# Patient Record
Sex: Male | Born: 1950
Health system: Southern US, Community
[De-identification: ages and names within clinical notes are randomized; demographics above are authoritative.]

## PROBLEM LIST (undated history)

## (undated) DIAGNOSIS — R251 Tremor, unspecified: Secondary | ICD-10-CM

## (undated) DIAGNOSIS — H9313 Tinnitus, bilateral: Secondary | ICD-10-CM

## (undated) DIAGNOSIS — H903 Sensorineural hearing loss, bilateral: Secondary | ICD-10-CM

## (undated) DIAGNOSIS — J322 Chronic ethmoidal sinusitis: Secondary | ICD-10-CM

## (undated) DIAGNOSIS — J370 Chronic laryngitis: Secondary | ICD-10-CM

## (undated) DIAGNOSIS — J3501 Chronic tonsillitis: Secondary | ICD-10-CM

## (undated) DIAGNOSIS — Z9889 Other specified postprocedural states: Secondary | ICD-10-CM

## (undated) DIAGNOSIS — J301 Allergic rhinitis due to pollen: Secondary | ICD-10-CM

## (undated) DIAGNOSIS — J320 Chronic maxillary sinusitis: Secondary | ICD-10-CM

## (undated) DIAGNOSIS — K219 Gastro-esophageal reflux disease without esophagitis: Secondary | ICD-10-CM

## (undated) DIAGNOSIS — J45909 Unspecified asthma, uncomplicated: Secondary | ICD-10-CM

## (undated) HISTORY — DX: Allergic rhinitis due to pollen: J30.1

## (undated) HISTORY — PX: FRACTURE SURGERY: SHX138

## (undated) HISTORY — DX: Chronic laryngitis: J37.0

## (undated) HISTORY — DX: Tremor, unspecified: R25.1

## (undated) HISTORY — DX: Chronic maxillary sinusitis: J32.0

## (undated) HISTORY — DX: Other specified postprocedural states: Z98.890

## (undated) HISTORY — DX: Chronic tonsillitis: J35.01

## (undated) HISTORY — DX: Chronic ethmoidal sinusitis: J32.2

## (undated) HISTORY — DX: Unspecified asthma, uncomplicated: J45.909

## (undated) HISTORY — DX: Tinnitus, bilateral: H93.13

## (undated) HISTORY — DX: Sensorineural hearing loss, bilateral: H90.3

## (undated) HISTORY — DX: Gastro-esophageal reflux disease without esophagitis: K21.9

---

## 1995-05-15 DIAGNOSIS — Z9289 Personal history of other medical treatment: Secondary | ICD-10-CM

## 1995-05-15 HISTORY — DX: Personal history of other medical treatment: Z92.89

## 1999-06-09 ENCOUNTER — Encounter: Payer: Self-pay | Admitting: Family Medicine

## 1999-06-09 ENCOUNTER — Ambulatory Visit (HOSPITAL_COMMUNITY): Admission: RE | Admit: 1999-06-09 | Discharge: 1999-06-09 | Payer: Self-pay | Admitting: Family Medicine

## 1999-07-18 ENCOUNTER — Encounter: Payer: Self-pay | Admitting: Emergency Medicine

## 1999-07-18 ENCOUNTER — Emergency Department (HOSPITAL_COMMUNITY): Admission: EM | Admit: 1999-07-18 | Discharge: 1999-07-18 | Payer: Self-pay | Admitting: Emergency Medicine

## 1999-07-27 ENCOUNTER — Ambulatory Visit (HOSPITAL_COMMUNITY): Admission: RE | Admit: 1999-07-27 | Discharge: 1999-07-27 | Payer: Self-pay | Admitting: Neurology

## 1999-07-27 ENCOUNTER — Encounter: Payer: Self-pay | Admitting: Neurology

## 2003-05-15 HISTORY — PX: WRIST SURGERY: SHX841

## 2003-12-07 ENCOUNTER — Ambulatory Visit (HOSPITAL_COMMUNITY): Admission: RE | Admit: 2003-12-07 | Discharge: 2003-12-08 | Payer: Self-pay | Admitting: Orthopedic Surgery

## 2006-12-31 LAB — HM COLONOSCOPY

## 2007-08-07 ENCOUNTER — Emergency Department (HOSPITAL_COMMUNITY): Admission: EM | Admit: 2007-08-07 | Discharge: 2007-08-08 | Payer: Self-pay | Admitting: Emergency Medicine

## 2010-04-12 ENCOUNTER — Encounter: Admission: RE | Admit: 2010-04-12 | Discharge: 2010-04-12 | Payer: Self-pay | Admitting: Orthopedic Surgery

## 2010-09-29 NOTE — Consult Note (Signed)
. [facility]  Patient:    Scott Barker, Scott Barker                       MRN: [id number] Attending:  Glean Hess, M.D.                          Consultation Report  NO DICTATION DD:  07/18/99 TD:  07/18/99 Job: [id number]

## 2010-09-29 NOTE — Op Note (Signed)
Scott Barker, Scott Barker                        ACCOUNT NO.:  [id number]   MEDICAL RECORD NO.:  [id number]                   PATIENT TYPE:  OIB   LOCATION:  2899                                 FACILITY:  [facility]   PHYSICIAN:  Dionne Ano. Everlene Other, M.D.         DATE OF BIRTH:  1950/07/18   DATE OF PROCEDURE:  12/07/2003  DATE OF DISCHARGE:                                 OPERATIVE REPORT   PREOPERATIVE DIAGNOSIS:  Comminuted complex metaphyseal fractures about the  right and left wrist.  This patient has interval displacement, progressive  angulatory collapse, and poor position.  He is status post initial  stabilization in the [place] region.   POSTOPERATIVE DIAGNOSIS:  Comminuted complex metaphyseal fractures about the  right and left wrist.  This patient has interval displacement, progressive  angulatory collapse, and poor position.  He is status post initial  stabilization in the [place] region.   PROCEDURE:  1. Open reduction and internal fixation right comminuted distal radius     fracture with AccuMed plate and screw construct and Allomatrix bone     grafting from [facility].  2. Open reduction and internal fixation comminuted, complex left distal     radius fracture with AccuMed plate and screw construct and Allomatrix     bone graft.  3. Stress radiography bilateral upper extremity.   SURGEON:  Dionne Ano. Amanda Pea, M.D.   ASSISTANT:  Karie Chimera, P.A.-C.   COMPLICATIONS:  None.   DRAINS:  Two.   ANESTHESIA:  General.   ESTIMATED BLOOD LOSS:  Minimal.   INDICATIONS FOR PROCEDURE:  This patient presents with bilateral wrist  fractures.  I have discussed the risks and benefits of surgery including the  risks of infection, bleeding, anesthesia, damage to normal structures, and  failure of surgery to accomplish the intended.  With this in mind, she  desires to proceed.  All questions were encouraged and answered  preoperatively.   OPERATIVE FINDINGS:   The patient had comminuted complex fracture.  He  underwent ORIF without difficulty.  The left side was certainly more  comminuted than the right and we will hold him in supination for three weeks  postop in a long arm cast to prevent contracture.  We will be more  aggressive with the right arm in terms of its rehabilitative protocol and I  have discussed this with the family.   OPERATIVE PROCEDURE IN DETAIL:  The patient was seen by myself and  anesthesia.  He was given preoperative Ancef.  He was taken to the operative  suite and underwent the smooth induction of general anesthesia.  He was laid  supine, appropriately padded, prepped and draped in the usual sterile  fashion.  Once this was done, the patient had the right arm and left arm  secured nicely.  The operation commenced with the right arm ORIF.  The  tourniquet was inflated to 250 mmHg.  A volar radial incision  was made.  The  FCR was split palmarly and dorsally, retracted ulnarly.  The carpal canal  contents were retracted ulnarly.  The pronator quadratus was retracted in a  radial to ulnar fashion after it was incised in an L-shaped fashion and  later repaired at the end of the case.  The fracture site was then  identified.  After this, the fracture site was accessed, reduction was  accomplished, held provisionally with K-wires, and AccuMed plate was placed  followed by placement of allograft bone grafting in the metaphyseal defect  (dorsal V defect).  Once this was done, the patient then underwent stress  radiography revealing an excellent reduction.  I was pleased with the  findings, deflated the tourniquet, obtained hemostasis, placed a #7 TLS  drain, and following this, closed the wound about the pronator quadratus  with 3-0 Vicryl followed by closure of the subcu with 3-0 Vicryl followed by  closure of skin edges with Prolene.  The drain was hooked up to suction.  A  sterile dressing was applied.  At the conclusion of  both cases, the splint  was applied.  He tolerated the procedure well and there were no complicating  features in regards to the right upper extremity.  I should note that the  stress radiographs showed excellent position and good fixation.  I was quite  pleased with the fixation overall with this fracture.  It was certainly less  comminuted than the left.   Once this was done, the patient had the left upper extremity isolated.  The  left upper extremity had the tourniquet inflated to 250 mmHg.  Following  this, a volar radial incision was made.  Dissection was carried through the  sheath with the knife blade dorsally and palmarly.  The FCR was retracted  radially and the carpal canal contents were retracted in a radial to Paediatric nurse.  Following this, the pronator quadratus was split in an L-shaped  fashion, retracted in a radial to ulnar direction.  Following this, the  patient had the fracture site accessed.  This fracture was highly  comminuted.  I had to very carefully and meticulously piece together the  fracture site.  This was held together with three Kirschner wires followed  by placement of an Acculock plate.  The Acculock plate was placed without  difficulty in standard AO technique and had excellent purchase.  Following  this, allograft bone graft in the form of Allometric bone graft was placed  to fill the defect/void in the metaphyseal region.  Following this, the  patient had stress radiographs performed which revealed excellent reduction.  The patient had a stable distal radial ulnar joint, however, I am going to  keep him in supination due to the fact that he is highly comminuted in this  region.  Once this was done, the patient then underwent placement of TLS  drain.  The pronator quadratus was repaired with 3-0 Vicryl as it was on the  opposite side. The subcu was closed with 3-0 Vicryl, the skin edge with Prolene, and the patient had a soft dressing applied followed  by a long arm  splint with the forearm in supination.  The right upper extremity had a  short arm splint applied.  All compartments were soft.  He was vascular  intact with excellent refill at the conclusion of the case.  There were no  complicating features with the surgery.  He will be monitored in the  recovery room and admitted  for IV antibiotics, pain control, and postop  management.  I have discussed some dos and don'ts, etc.  In addition to  this, I have discussed with the family the plans postoperatively.  It was a  pleasure to participate in his care and look forward to participating in his  postoperative recovery.                                               Dionne Ano. Everlene Other, M.D.    Nash Mantis  D:  12/07/2003  T:  12/07/2003  Job:  [id number]

## 2011-02-05 LAB — POCT CARDIAC MARKERS
CKMB, poc: <1 — ABNORMAL LOW
Myoglobin, poc: 38.7
Myoglobin, poc: 42.7
Operator id: 161631
Troponin i, poc: <0.05

## 2011-02-05 LAB — POCT I-STAT, CHEM 8
BUN: 19
Creatinine, Ser: 1.5
Potassium: 4
Sodium: 141

## 2012-11-11 ENCOUNTER — Telehealth: Payer: Self-pay | Admitting: Radiation Oncology

## 2012-11-11 NOTE — Telephone Encounter (Signed)
Opened in error

## 2015-10-20 ENCOUNTER — Telehealth: Payer: Self-pay | Admitting: *Deleted

## 2015-10-20 ENCOUNTER — Encounter: Payer: Self-pay | Admitting: Internal Medicine

## 2015-10-20 ENCOUNTER — Ambulatory Visit (INDEPENDENT_AMBULATORY_CARE_PROVIDER_SITE_OTHER): Payer: BLUE CROSS/BLUE SHIELD | Admitting: Internal Medicine

## 2015-10-20 VITALS — BP 132/91 | HR 89 | Temp 98.1°F | Ht 72.0 in | Wt 176.2 lb

## 2015-10-20 DIAGNOSIS — H6505 Acute serous otitis media, recurrent, left ear: Secondary | ICD-10-CM

## 2015-10-20 DIAGNOSIS — Z789 Other specified health status: Secondary | ICD-10-CM | POA: Diagnosis not present

## 2015-10-20 DIAGNOSIS — R6884 Jaw pain: Secondary | ICD-10-CM

## 2015-10-20 DIAGNOSIS — J029 Acute pharyngitis, unspecified: Secondary | ICD-10-CM | POA: Diagnosis not present

## 2015-10-20 LAB — CBC WITH DIFFERENTIAL/PLATELET
BASOS ABS: 87 {cells}/uL (ref 0–200)
BASOS PCT: 1 %
EOS ABS: 174 {cells}/uL (ref 15–500)
Eosinophils Relative: 2 %
HCT: 50.2 % — ABNORMAL HIGH (ref 38.5–50.0)
HEMOGLOBIN: 17 g/dL (ref 13.2–17.1)
LYMPHS ABS: 2436 {cells}/uL (ref 850–3900)
Lymphocytes Relative: 28 %
MCH: 30.4 pg (ref 27.0–33.0)
MCHC: 33.9 g/dL (ref 32.0–36.0)
MCV: 89.8 fL (ref 80.0–100.0)
MONO ABS: 696 {cells}/uL (ref 200–950)
MPV: 11.1 fL (ref 7.5–12.5)
Monocytes Relative: 8 %
NEUTROS ABS: 5307 {cells}/uL (ref 1500–7800)
Neutrophils Relative %: 61 %
Platelets: 187 10*3/uL (ref 140–400)
RBC: 5.59 MIL/uL (ref 4.20–5.80)
RDW: 14.3 % (ref 11.0–15.0)
WBC: 8.7 10*3/uL (ref 3.8–10.8)

## 2015-10-20 LAB — COMPLETE METABOLIC PANEL WITH GFR
ALBUMIN: 4.3 g/dL (ref 3.6–5.1)
ALK PHOS: 64 U/L (ref 40–115)
ALT: 34 U/L (ref 9–46)
AST: 25 U/L (ref 10–35)
BILIRUBIN TOTAL: 0.6 mg/dL (ref 0.2–1.2)
BUN: 21 mg/dL (ref 7–25)
CO2: 26 mmol/L (ref 20–31)
CREATININE: 1.24 mg/dL (ref 0.70–1.25)
Calcium: 9.7 mg/dL (ref 8.6–10.3)
Chloride: 105 mmol/L (ref 98–110)
GFR, EST AFRICAN AMERICAN: 71 mL/min (ref >=60)
GFR, EST NON AFRICAN AMERICAN: 61 mL/min (ref >=60)
GLUCOSE: 83 mg/dL (ref 65–99)
Potassium: 5 mmol/L (ref 3.5–5.3)
Sodium: 142 mmol/L (ref 135–146)
TOTAL PROTEIN: 7.5 g/dL (ref 6.1–8.1)

## 2015-10-20 LAB — C-REACTIVE PROTEIN: CRP: <0.5 mg/dL (ref <0.60)

## 2015-10-20 NOTE — Telephone Encounter (Signed)
CT exams appointment:  [facility], [address] Wed., June 21, arrive at 10:00 AM.  Nothing solid to eat 4 hours prior to the scans, 6:30 AM.  Patient may have liquids.  Patient verbalized understanding.

## 2015-10-20 NOTE — Progress Notes (Signed)
RFV: chronic otitis externa Subjective:    Patient ID: Scott BonnetMark L Barker, male    DOB: 07-16-1950, 65 y.o.   MRN: [id number]  HPI Mr. Scott KailRoberston is a patient of Dr. Haroldine Barker referred to help with further management of chronic otitis externa. i have reviewed the records sent by Scott Barker office. This is a 65yo M with history of left side ear infection, started 3 days after returning from [place] in late oct 2016. He was doing family trip with his daughter in law- family. For 10 days stayed in nice accomodations. Did not get ill during the trip.  He noticed having fullness to left ear. That lingered for awhile and noticed left jaw pain. He was referred to dentistry to rule out tmj or dental caries. He started to see Scott Barker in mid March where he was 1st round of abtx, jaw pain improved. After 2 wks of being off of abtx started to have recurrence of pain. Thus 2nd round of  abtx was given, Again 2 wks after completion started to have recurrence of jaw pain. His 3rd course of abtx was noted in beginning of may, levofloxacin. he has had slightly longer window of symptom free of 4-5 wks. His Left ear still feels fullness, and now his right sided sore throat.   He does state that he has had history of chronic sinus issues since childhood  Allergies no known allergies   Meds:  None, regularly  Past med hx: bilateral fracture arms roughly 6031yr. S/p fixation. Seasonal allergies. No hx of childhood illness.Chelation therapy in the past with dr. Alessandra Barker. No longer seeing her  Family hx:   No family history of seasonal allergies or ear infections. htn   Dr. Cliffton Barker at [facility] is pcp   Review of Systems No fever or chills. But more temperature disregulation. Otherwise 10 point ros is negative    Objective:   Physical Exam BP 132/91 mmHg  Pulse 89  Temp(Src) 98.1 F (36.7 C) (Oral)  Ht 6' (1.829 m)  Wt 176 lb 4 oz (79.946 kg)  BMI 23.90 kg/m2 .Physical Exam  Constitutional: He is oriented to  person, place, and time. He appears well-developed and well-nourished. No distress.  HENT:  Mouth/Throat: Oropharynx is clear and moist. No oropharyngeal exudate. TM are clear Cardiovascular: Normal rate, regular rhythm and normal heart sounds. Exam reveals no gallop and no friction rub.  No murmur heard.  Pulmonary/Chest: Effort normal and breath sounds normal. No respiratory distress. He has no wheezes.  Abdominal: Soft. Bowel sounds are normal. He exhibits no distension. There is no tenderness.  Lymphadenopathy:  He has no cervical adenopathy.  Neurological: He is alert and oriented to person, place, and time.  Skin: Skin is warm and dry. No rash noted. No erythema.  Psychiatric: He has a normal mood and affect. His behavior is normal.   Lab Results  Component Value Date   ESRSEDRATE 1 10/20/2015   Lab Results  Component Value Date   CRP <0.5 10/20/2015   Lab Results  Component Value Date   WBC 8.7 10/20/2015   HGB 17.0 10/20/2015   HCT 50.2* 10/20/2015   MCV 89.8 10/20/2015   PLT 187 10/20/2015   Imaging: IMPRESSION: heat CT  1. Largely unremarkable CT appearance of the face and neck. No neck mass, lymphadenopathy, or inflammatory process identified. The mandible appears intact with no acute dental disease evident. Normally pneumatized middle ears and mastoids. 2. There is fairly advanced chronic cervical spine degeneration noted.  Assessment & Plan:  Chronic otitis externa = at this time appear stable. Scott not recommend further abtx at this time  Jaw pain = this may be referred pain vs. Signs of early osteomyelitis. Scott check head CT and check inflammatory markers. If symptoms progress, may need to get mri of jaw.   Pharyngitis = likely uri. Recommend supportive care  Spent 45 min with patient with greater than 50% in coordination of care, discussion of options for further management

## 2015-10-21 LAB — SEDIMENTATION RATE: SED RATE: 1 mm/h (ref 0–20)

## 2015-10-21 LAB — HEPATITIS C ANTIBODY: HCV AB: NEGATIVE

## 2015-10-21 LAB — HIV ANTIBODY (ROUTINE TESTING W REFLEX): HIV 1&2 Ab, 4th Generation: NONREACTIVE

## 2015-10-31 ENCOUNTER — Other Ambulatory Visit: Payer: Self-pay | Admitting: Internal Medicine

## 2015-10-31 DIAGNOSIS — R6884 Jaw pain: Secondary | ICD-10-CM

## 2015-11-02 ENCOUNTER — Ambulatory Visit
Admission: RE | Admit: 2015-11-02 | Discharge: 2015-11-02 | Disposition: A | Discharge status: Home / Self Care | Payer: BLUE CROSS/BLUE SHIELD | Source: Ambulatory Visit | Attending: Internal Medicine | Admitting: Internal Medicine

## 2015-11-02 ENCOUNTER — Telehealth: Payer: Self-pay | Admitting: *Deleted

## 2015-11-02 DIAGNOSIS — J029 Acute pharyngitis, unspecified: Secondary | ICD-10-CM

## 2015-11-02 DIAGNOSIS — R6884 Jaw pain: Secondary | ICD-10-CM

## 2015-11-02 MED ORDER — IOPAMIDOL (ISOVUE-300) INJECTION 61%
72.0000 mL | Freq: Once | INTRAVENOUS | Status: AC | PRN
  Administered 2015-11-02: 72 mL via INTRAVENOUS

## 2015-11-02 NOTE — Telephone Encounter (Signed)
Combining scan to include head and neck, charge for only one scan to the patient.

## 2015-12-05 ENCOUNTER — Ambulatory Visit: Payer: BLUE CROSS/BLUE SHIELD | Admitting: Internal Medicine

## 2015-12-09 ENCOUNTER — Encounter: Payer: Self-pay | Admitting: Internal Medicine

## 2015-12-13 ENCOUNTER — Encounter: Payer: Self-pay | Admitting: Internal Medicine

## 2015-12-13 ENCOUNTER — Ambulatory Visit (INDEPENDENT_AMBULATORY_CARE_PROVIDER_SITE_OTHER): Payer: BLUE CROSS/BLUE SHIELD | Admitting: Internal Medicine

## 2015-12-13 VITALS — BP 153/97 | HR 64 | Temp 97.8°F | Wt 175.0 lb

## 2015-12-13 DIAGNOSIS — H6505 Acute serous otitis media, recurrent, left ear: Secondary | ICD-10-CM | POA: Diagnosis not present

## 2016-01-20 NOTE — Progress Notes (Signed)
   RFV: follow up for recurrent otitis externa  Patient ID: Scott Barker, male   DOB: Nov 09, 1950, 65 y.o.   MRN: [id number]  HPI  65yo M with history of recurrent otitis externa was initially seen roughly 7-8 wk in ID clinic to see if he had signs concerning for more invasive disease, possibly osteomyelitis due to recurrent symptoms. At that visit, his symptoms were minimal and did not suggest need for ongoing abtx. He did undergo CT imaging that was unremarkable. Since your initial appointment, he states that he has not really had much further difficulty with ear or jaw discomfort.   No outpatient encounter prescriptions on file as of 12/13/2015.   No facility-administered encounter medications on file as of 12/13/2015.      There are no active problems to display for this patient.    Health Maintenance Due  Topic Date Due  . TETANUS/TDAP  02/25/1970  . COLONOSCOPY  02/25/2001  . ZOSTAVAX  02/26/2011  . INFLUENZA VACCINE  12/13/2015     Review of Systems  Physical Exam   BP (!) 153/97   Pulse 64   Temp 97.8 F (36.6 C) (Oral)   Wt 175 lb (79.4 kg)   BMI 23.73 kg/m   Physical Exam  Constitutional: He is oriented to person, place, and time. He appears well-developed and well-nourished. No distress.  HENT:  Mouth/Throat: Oropharynx is clear and moist. No oropharyngeal exudate.  Psychiatric: He has a normal mood and affect. His behavior is normal.    CBC Lab Results  Component Value Date   WBC 8.7 10/20/2015   RBC 5.59 10/20/2015   HGB 17.0 10/20/2015   HCT 50.2 (H) 10/20/2015   PLT 187 10/20/2015   MCV 89.8 10/20/2015   MCH 30.4 10/20/2015   MCHC 33.9 10/20/2015   RDW 14.3 10/20/2015   LYMPHSABS 2,436 10/20/2015   MONOABS 696 10/20/2015   EOSABS 174 10/20/2015   BASOSABS 87 10/20/2015   BMET Lab Results  Component Value Date   NA 142 10/20/2015   K 5.0 10/20/2015   CL 105 10/20/2015   CO2 26 10/20/2015   GLUCOSE 83 10/20/2015   BUN 21 10/20/2015   CREATININE 1.24 10/20/2015   CALCIUM 9.7 10/20/2015   GFRNONAA 61 10/20/2015   GFRAA 71 10/20/2015     Assessment and Plan  Recurrent otitis externa = appears to be resolved. For now, would not recommend any further testing unless flare occurs.

## 2016-08-29 ENCOUNTER — Emergency Department (HOSPITAL_COMMUNITY)
Admission: EM | Admit: 2016-08-29 | Discharge: 2016-08-29 | Disposition: A | Discharge status: Home / Self Care | Payer: Medicare Other | Attending: Emergency Medicine | Admitting: Emergency Medicine

## 2016-08-29 ENCOUNTER — Encounter (HOSPITAL_COMMUNITY): Payer: Self-pay | Admitting: Emergency Medicine

## 2016-08-29 ENCOUNTER — Emergency Department (HOSPITAL_COMMUNITY): Payer: Medicare Other

## 2016-08-29 DIAGNOSIS — R1032 Left lower quadrant pain: Secondary | ICD-10-CM | POA: Diagnosis present

## 2016-08-29 DIAGNOSIS — J45909 Unspecified asthma, uncomplicated: Secondary | ICD-10-CM | POA: Insufficient documentation

## 2016-08-29 LAB — URINALYSIS, ROUTINE W REFLEX MICROSCOPIC
BILIRUBIN URINE: NEGATIVE
GLUCOSE, UA: NEGATIVE mg/dL
HGB URINE DIPSTICK: NEGATIVE
Ketones, ur: 80 mg/dL — AB
Leukocytes, UA: NEGATIVE
Nitrite: NEGATIVE
Protein, ur: NEGATIVE mg/dL
SPECIFIC GRAVITY, URINE: 1.014 (ref 1.005–1.030)
pH: 5 (ref 5.0–8.0)

## 2016-08-29 LAB — COMPREHENSIVE METABOLIC PANEL
ALBUMIN: 4.6 g/dL (ref 3.5–5.0)
ALK PHOS: 56 U/L (ref 38–126)
ALT: 18 U/L (ref 17–63)
AST: 19 U/L (ref 15–41)
Anion gap: 9 (ref 5–15)
BUN: 17 mg/dL (ref 6–20)
CALCIUM: 9.4 mg/dL (ref 8.9–10.3)
CHLORIDE: 103 mmol/L (ref 101–111)
CO2: 26 mmol/L (ref 22–32)
CREATININE: 1.05 mg/dL (ref 0.61–1.24)
GFR calc Af Amer: >60 mL/min (ref >60)
GFR calc non Af Amer: >60 mL/min (ref >60)
GLUCOSE: 86 mg/dL (ref 65–99)
Potassium: 4.2 mmol/L (ref 3.5–5.1)
SODIUM: 138 mmol/L (ref 135–145)
Total Bilirubin: 1.6 mg/dL — ABNORMAL HIGH (ref 0.3–1.2)
Total Protein: 8 g/dL (ref 6.5–8.1)

## 2016-08-29 LAB — CBC
HCT: 50.2 % (ref 39.0–52.0)
HEMOGLOBIN: 17.3 g/dL — AB (ref 13.0–17.0)
MCH: 30.5 pg (ref 26.0–34.0)
MCHC: 34.5 g/dL (ref 30.0–36.0)
MCV: 88.5 fL (ref 78.0–100.0)
PLATELETS: 191 10*3/uL (ref 150–400)
RBC: 5.67 MIL/uL (ref 4.22–5.81)
RDW: 13.5 % (ref 11.5–15.5)
WBC: 6 10*3/uL (ref 4.0–10.5)

## 2016-08-29 LAB — LIPASE, BLOOD: LIPASE: 38 U/L (ref 11–51)

## 2016-08-29 MED ORDER — SODIUM CHLORIDE 0.9 % IV SOLN
INTRAVENOUS | Status: DC
  Administered 2016-08-29: 13:00:00 via INTRAVENOUS

## 2016-08-29 MED ORDER — IOPAMIDOL (ISOVUE-300) INJECTION 61%
INTRAVENOUS | Status: AC
Start: 2016-08-29 — End: 2016-08-29
  Administered 2016-08-29: 100 mL via INTRAVENOUS
  Filled 2016-08-29: qty 100

## 2016-08-29 NOTE — ED Provider Notes (Signed)
[facility]-EMERGENCY DEPT Provider Note   CSN: [id number] Arrival date & time: 08/29/16  8413     History   Chief Complaint Chief Complaint  Patient presents with  . Abdominal Pain    HPI Scott Barker is a 66 y.o. male.  HPI  Patient presents with concern of ongoing abdominal pain. The pain began about one week ago, since onset in the left lower quadrant and has began to radiate across the entire lower abdomen. Patient also describes some loose stool, though no frank diarrhea. No associated hunger changes, no nausea, no vomiting. Since onset no clear alleviating or exacerbating factors, though food intake may make the pain better inconsistently. Patient notes he is generally well, denies potential medical problems. Patient has had one colonoscopy, which was unremarkable.   Past Medical History:  Diagnosis Date  . Asthma   . Chronic ethmoidal sinusitis   . Chronic laryngitis   . Chronic maxillary sinusitis   . Chronic tonsillitis   . GERD (gastroesophageal reflux disease)   . Hay fever   . Sensory hearing loss, bilateral   . Tinnitus of both ears     There are no active problems to display for this patient.   Past Surgical History:  Procedure Laterality Date  . FRACTURE SURGERY     wrist       Home Medications    Prior to Admission medications   Not on File    Family History No family history on file.  Social History Social History  Substance Use Topics  . Smoking status: Never Smoker  . Smokeless tobacco: Never Used  . Alcohol use No     Allergies   Patient has no allergy information on record.   Review of Systems Review of Systems  Constitutional:       Per HPI, otherwise negative  HENT:       Per HPI, otherwise negative  Respiratory:       Per HPI, otherwise negative  Cardiovascular:       Per HPI, otherwise negative  Gastrointestinal: Positive for abdominal pain. Negative for vomiting.  Endocrine:       Negative aside from HPI    Genitourinary:       Neg aside from HPI   Musculoskeletal:       Per HPI, otherwise negative  Skin: Negative.   Neurological: Negative for syncope.     Physical Exam Updated Vital Signs BP (!) 144/96 (BP Location: Right Arm)   Pulse 74   Temp 97.5 F (36.4 C) (Oral)   Resp 15   Ht 6' (1.829 m)   Wt 164 lb (74.4 kg)   SpO2 100%   BMI 22.24 kg/m   Physical Exam  Constitutional: He is oriented to person, place, and time. He appears well-developed. No distress.  HENT:  Head: Normocephalic and atraumatic.  Eyes: Conjunctivae and EOM are normal.  Cardiovascular: Normal rate and regular rhythm.   Pulmonary/Chest: Effort normal. No stridor. No respiratory distress.  Abdominal: He exhibits no distension.  Mild lower abdominal pain, no guarding, no rebound, no mass  Musculoskeletal: He exhibits no edema.  Neurological: He is alert and oriented to person, place, and time.  Skin: Skin is warm and dry.  Psychiatric: He has a normal mood and affect.  Nursing note and vitals reviewed.    ED Treatments / Results  Labs (all labs ordered are listed, but only abnormal results are displayed) Labs Reviewed  COMPREHENSIVE METABOLIC PANEL - Abnormal; Notable for the  following:       Result Value   Total Bilirubin 1.6 (*)    All other components within normal limits  CBC - Abnormal; Notable for the following:    Hemoglobin 17.3 (*)    All other components within normal limits  URINALYSIS, ROUTINE W REFLEX MICROSCOPIC - Abnormal; Notable for the following:    Ketones, ur 80 (*)    All other components within normal limits  LIPASE, BLOOD    Radiology Ct Abdomen Pelvis W Contrast  Result Date: 08/29/2016 CLINICAL DATA:  Left lower quadrant pain. Bowel changes. Symptoms for 10 days. EXAM: CT ABDOMEN AND PELVIS WITH CONTRAST TECHNIQUE: Multidetector CT imaging of the abdomen and pelvis was performed using the standard protocol following bolus administration of intravenous contrast.  CONTRAST:  ISOVUE-300 IOPAMIDOL (ISOVUE-300) INJECTION 61% COMPARISON:  None. FINDINGS: Lower chest:  No contributory findings. Hepatobiliary: Patchy low-density in the right liver which is likely steatosis. No underlying vascular or biliary abnormality is noted. Subcentimeter low-density in the inferior right liver which is indeterminate density on source images, but appears cystic on the thin or coronal reformats. There is no reported history of malignancy. Pancreas: Unremarkable. Spleen: Subcentimeter low-density anteriorly, usually incidental. Adrenals/Urinary Tract: Negative adrenals. No hydronephrosis or stone. Unremarkable bladder. Stomach/Bowel:  No obstruction. No appendicitis. Vascular/Lymphatic: Atherosclerotic wall thickening and calcification of the aorta and proximal iliacs. No acute vascular finding. No mass or adenopathy. Reproductive:Mild symmetric enlargement of the prostate. Other: No ascites or pneumoperitoneum. Musculoskeletal: Spondylosis and disc degeneration. No acute or aggressive finding IMPRESSION: 1. No acute finding to explain abdominal pain. 2. Patchy steatosis in the right liver. 3.  Aortic Atherosclerosis (ICD10-170.0) Electronically Signed   By: Marnee Spring M.D.   On: 08/29/2016 13:53    Procedures Procedures (including critical care time)  Medications Ordered in ED Medications  0.9 %  sodium chloride infusion ( Intravenous New Bag/Given 08/29/16 1236)  iopamidol (ISOVUE-300) 61 % injection (100 mLs Intravenous Contrast Given 08/29/16 1334)     Initial Impression / Assessment and Plan / ED Course  I have reviewed the triage vital signs and the nursing notes.  Pertinent labs & imaging results that were available during my care of the patient were reviewed by me and considered in my medical decision making (see chart for details).  On repeat exam the patient is in no distress. I discussed all findings the patient and his wife, including ketonuria, some  concern for mild dehydration, possible given the patient's persistent abdominal pain. Patient has received 1 unit fluid resuscitation, was encouraged to pursue oral hydration. With no evidence for diverticulosis, colitis, acute abdominal processes, with unremarkable labs aside from mild hyperbilirubinemia, the patient is appropriate for further evaluation, management as an outpatient. Gastroenterology follow-up referral provided.   Final Clinical Impressions(s) / ED Diagnoses  Abdominal pain   Gerhard Munch, MD 08/29/16 [phone]

## 2016-08-29 NOTE — Discharge Instructions (Signed)
As discussed, your evaluation today has been largely reassuring.  But, it is important that you monitor your condition carefully, and do not hesitate to return to the ED if you develop new, or concerning changes in your condition. ? ?Otherwise, please follow-up with your physician for appropriate ongoing care. ? ?

## 2016-08-29 NOTE — ED Triage Notes (Signed)
Patient c/o abd pain that has been going on over week. Patient states that pain started out LLQ and now lower abd. Patient denies d/v, states intermittent nausea but can eat and drink fine.  Patient states that pain is intermittent.

## 2018-08-24 IMAGING — CT CT ABD-PELV W/ CM
2 of 5 series · 16 of 46 positions shown, 18 images · IV contrast (ISOVUE)
Comparison: None.

CLINICAL DATA: Left lower quadrant pain. Bowel changes. Symptoms
for 10 days.

EXAM:
CT ABDOMEN AND PELVIS WITH CONTRAST
TECHNIQUE: Multidetector CT imaging of the abdomen and pelvis was performed
using the standard protocol following bolus administration of
intravenous contrast.
CONTRAST:  100mL [id number] IOPAMIDOL (XZ2HIJ-F66) INJECTION 61%

[Series 2: abd/pel with · axial · 0.78mm/px · z∈[+1180,+1575]mm · 13 of 91 slices shown, 15 images]
[im 6/91  soft-tissue]
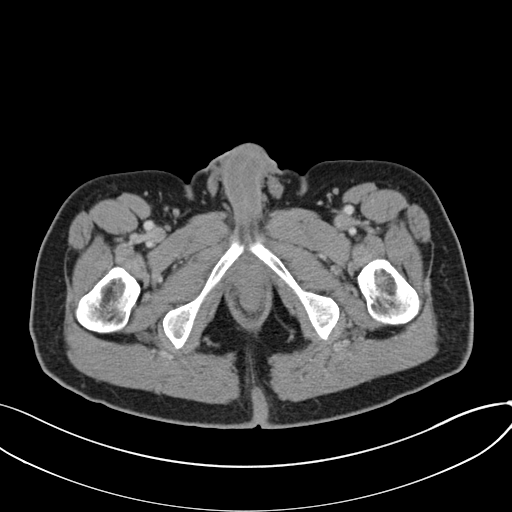
[im 6/91  bone]
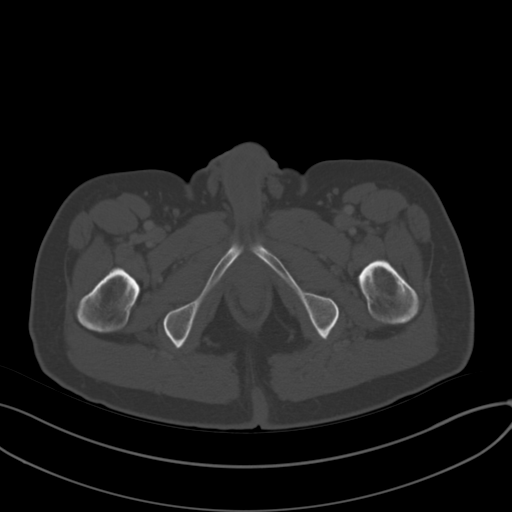
[im 12/91  soft-tissue]
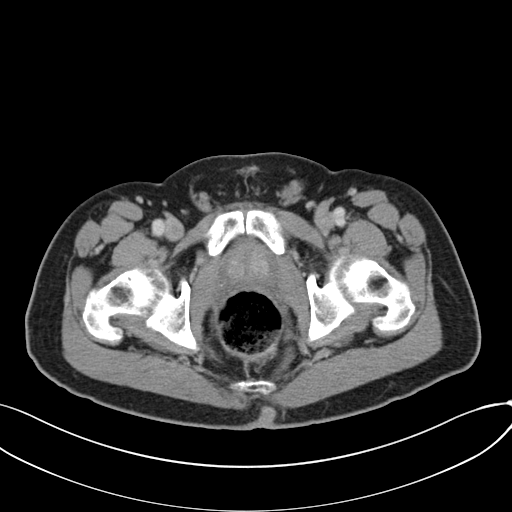
[im 17/91  soft-tissue]
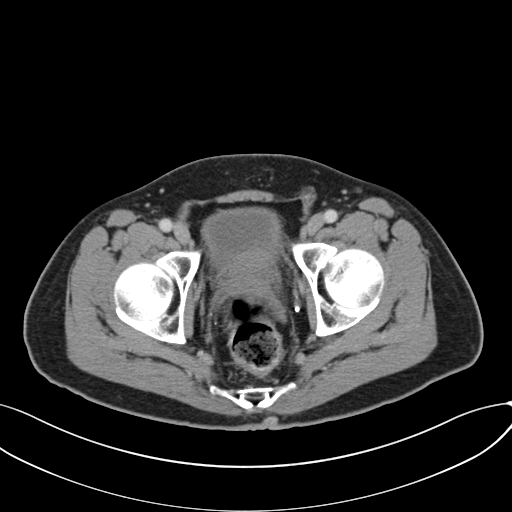
[im 29/91  soft-tissue]
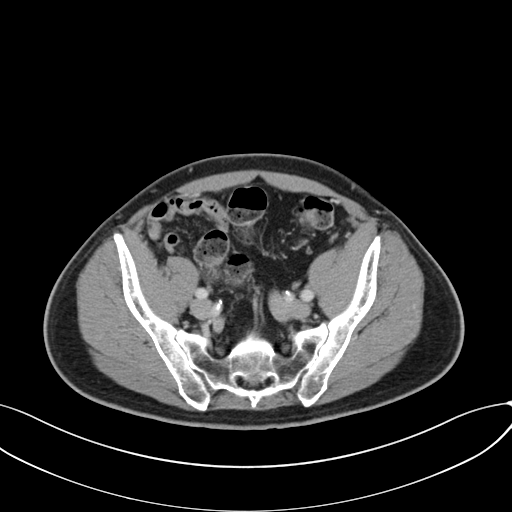
[im 34/91  soft-tissue]
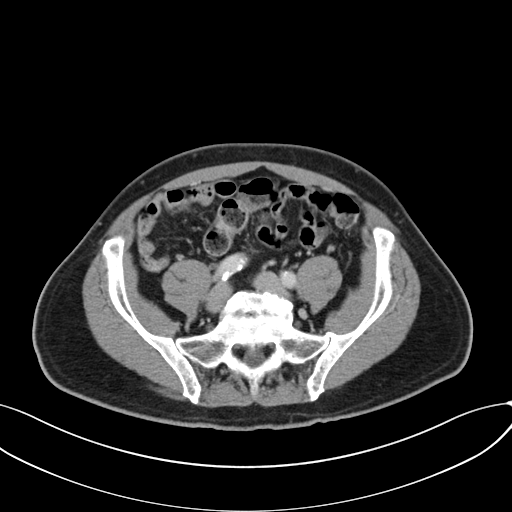
[im 40/91  soft-tissue]
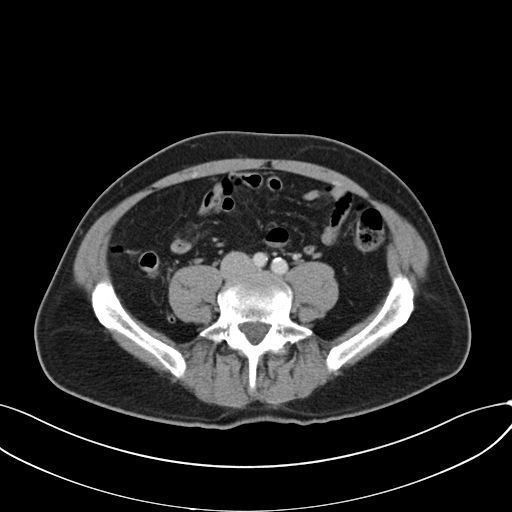
[im 46/91  soft-tissue]
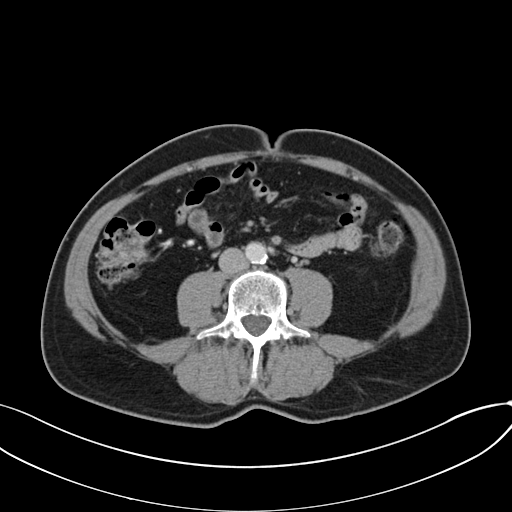
[im 51/91  soft-tissue]
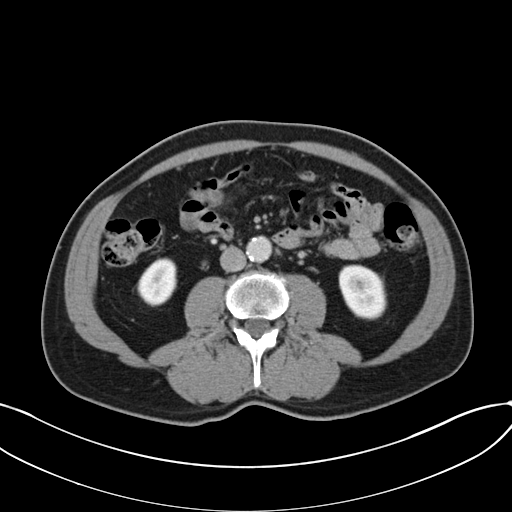
[im 57/91  soft-tissue]
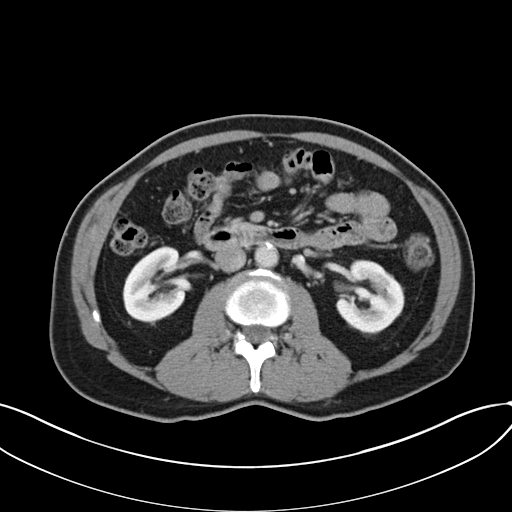
[im 57/91  bone]
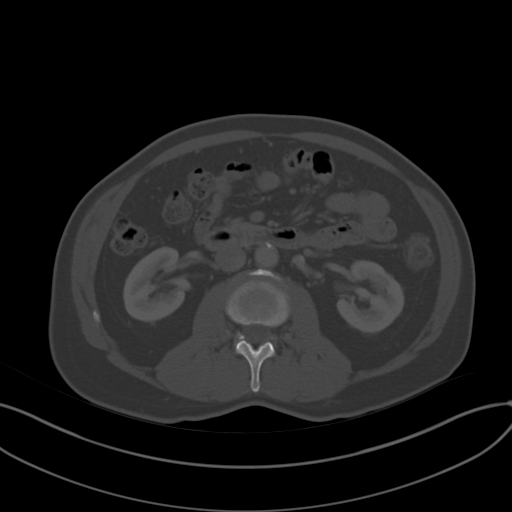
[im 62/91  soft-tissue]
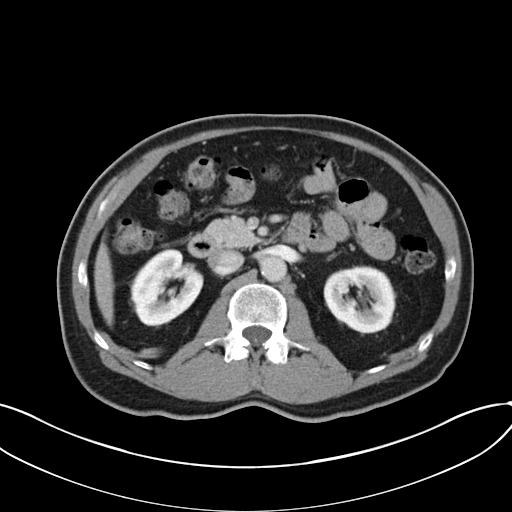
[im 74/91  soft-tissue]
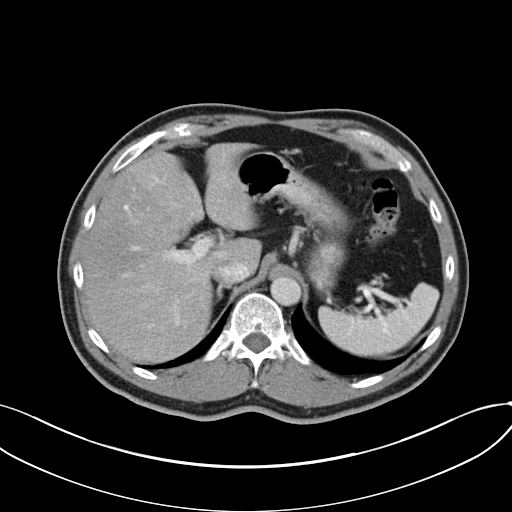
[im 79/91  soft-tissue]
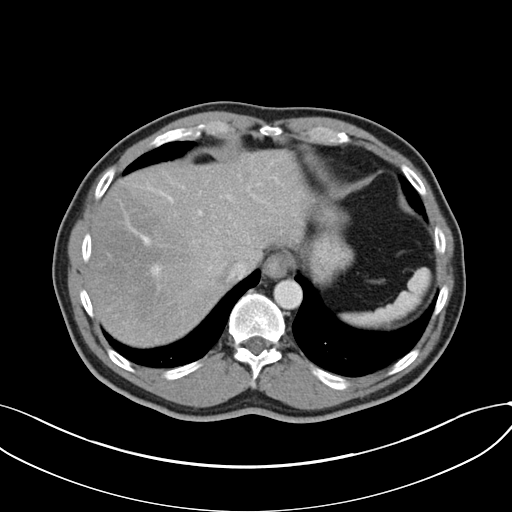
[im 85/91  soft-tissue]
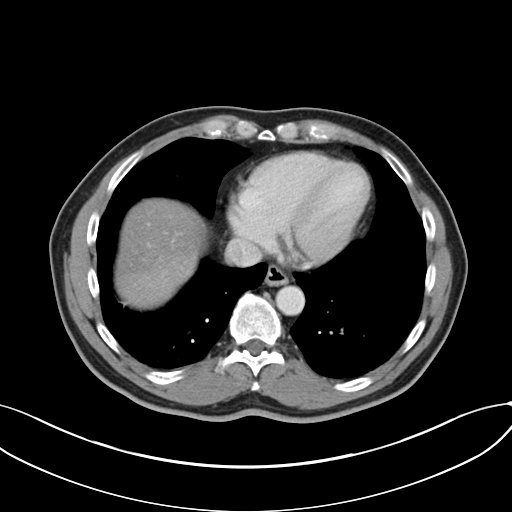

[Series 6: coronal a/|p · coronal · 0.74mm/px · 3 of 191 slices shown]
[im 64/191  soft-tissue]
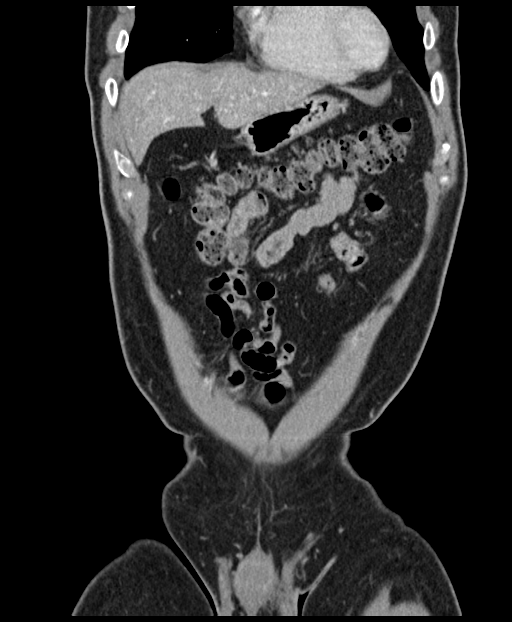
[im 85/191  soft-tissue]
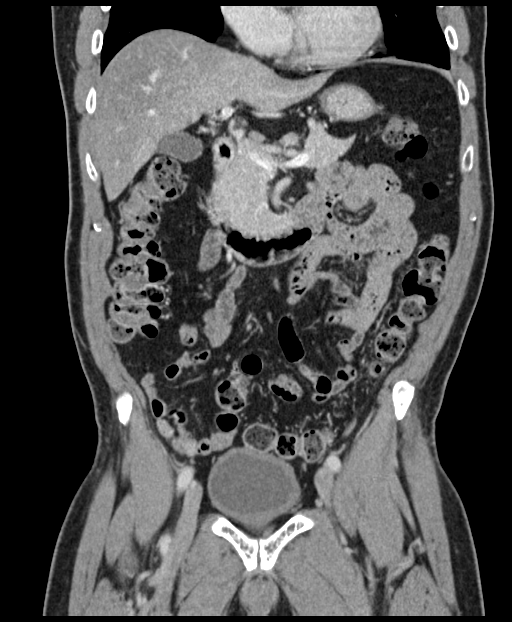
[im 106/191  soft-tissue]
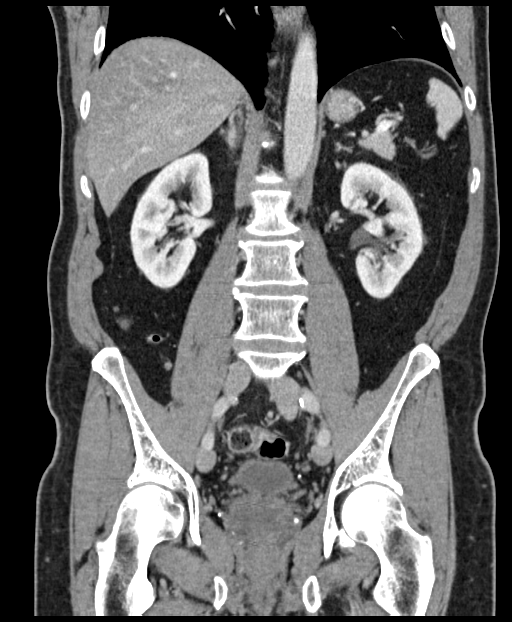

[16 of 46 positions shown; findings below may reference images not displayed]

FINDINGS: Lower chest:  No contributory findings.

Hepatobiliary: Patchy low-density in the right liver which is likely
steatosis. No underlying vascular or biliary abnormality is noted.
Subcentimeter low-density in the inferior right liver which is
indeterminate density on source images, but appears cystic on the
thin or coronal reformats. There is no reported history of
malignancy.

Pancreas: Unremarkable.

Spleen: Subcentimeter low-density anteriorly, usually incidental.

Adrenals/Urinary Tract: Negative adrenals. No hydronephrosis or
stone. Unremarkable bladder.

Stomach/Bowel:  No obstruction. No appendicitis.

Vascular/Lymphatic: Atherosclerotic wall thickening and
calcification of the aorta and proximal iliacs. No acute vascular
finding. No mass or adenopathy.

Reproductive:Mild symmetric enlargement of the prostate.

Other: No ascites or pneumoperitoneum.

Musculoskeletal: Spondylosis and disc degeneration. No acute or
aggressive finding
IMPRESSION: 1. No acute finding to explain abdominal pain.
2. Patchy steatosis in the right liver.
3.  Aortic Atherosclerosis (DDODC-170.0)

## 2019-05-19 ENCOUNTER — Ambulatory Visit: Payer: Medicare Other | Attending: Internal Medicine

## 2019-05-19 DIAGNOSIS — Z20822 Contact with and (suspected) exposure to covid-19: Secondary | ICD-10-CM | POA: Diagnosis not present

## 2019-05-21 LAB — NOVEL CORONAVIRUS, NAA: SARS-CoV-2, NAA: NOT DETECTED

## 2019-07-28 DIAGNOSIS — R7879 Finding of abnormal level of heavy metals in blood: Secondary | ICD-10-CM | POA: Diagnosis not present

## 2019-07-28 DIAGNOSIS — E782 Mixed hyperlipidemia: Secondary | ICD-10-CM | POA: Diagnosis not present

## 2019-07-28 DIAGNOSIS — R5383 Other fatigue: Secondary | ICD-10-CM | POA: Diagnosis not present

## 2019-07-28 DIAGNOSIS — E8881 Metabolic syndrome: Secondary | ICD-10-CM | POA: Diagnosis not present

## 2019-12-31 DIAGNOSIS — E018 Other iodine-deficiency related thyroid disorders and allied conditions: Secondary | ICD-10-CM | POA: Diagnosis not present

## 2019-12-31 DIAGNOSIS — E063 Autoimmune thyroiditis: Secondary | ICD-10-CM | POA: Diagnosis not present

## 2019-12-31 DIAGNOSIS — F419 Anxiety disorder, unspecified: Secondary | ICD-10-CM | POA: Diagnosis not present

## 2019-12-31 DIAGNOSIS — E039 Hypothyroidism, unspecified: Secondary | ICD-10-CM | POA: Diagnosis not present

## 2020-02-02 DIAGNOSIS — Z20822 Contact with and (suspected) exposure to covid-19: Secondary | ICD-10-CM | POA: Diagnosis not present

## 2020-02-03 DIAGNOSIS — Z20822 Contact with and (suspected) exposure to covid-19: Secondary | ICD-10-CM | POA: Diagnosis not present

## 2020-06-27 ENCOUNTER — Encounter: Payer: Self-pay | Admitting: Internal Medicine

## 2020-06-27 ENCOUNTER — Ambulatory Visit (INDEPENDENT_AMBULATORY_CARE_PROVIDER_SITE_OTHER): Payer: Medicare Other | Admitting: Internal Medicine

## 2020-06-27 ENCOUNTER — Other Ambulatory Visit: Payer: Self-pay

## 2020-06-27 VITALS — BP 128/82 | HR 84 | Temp 97.7°F | Ht 72.0 in | Wt 170.0 lb

## 2020-06-27 DIAGNOSIS — F5104 Psychophysiologic insomnia: Secondary | ICD-10-CM

## 2020-06-27 DIAGNOSIS — F419 Anxiety disorder, unspecified: Secondary | ICD-10-CM

## 2020-06-27 DIAGNOSIS — H9113 Presbycusis, bilateral: Secondary | ICD-10-CM | POA: Diagnosis not present

## 2020-06-27 DIAGNOSIS — Z9109 Other allergy status, other than to drugs and biological substances: Secondary | ICD-10-CM

## 2020-06-27 DIAGNOSIS — Z8589 Personal history of malignant neoplasm of other organs and systems: Secondary | ICD-10-CM

## 2020-06-27 DIAGNOSIS — I700 Atherosclerosis of aorta: Secondary | ICD-10-CM

## 2020-06-27 NOTE — Progress Notes (Signed)
Provider:  Gwenith Spitziffany L. Renato Gailseed, D.O., C.M.D. Location:   [facility]  Place of Service:    clinic  Previous PCP: Kermit Baloeed, Khayman Kirsch L, DO Patient Care Team: Kermit Baloeed, Tomeshia Pizzi L, DO as PCP - General (Geriatric Medicine) Keturah Barrerossley, James J, MD as Consulting Physician (Otolaryngology) Arminda ResidesJones, Daniel, MD as Consulting Physician (Dermatology)  Extended Emergency Contact Information Primary Emergency Contact: Carolanne Grumblingobertson,Beth Address: [address]          [place], [place] [address] [place] Home Phone: [phone] Relation: Spouse  Goals of Care: Advanced Directive information--discussed and he reports his wife would decide everything if he could not  Advanced Directives 06/27/2020  Does Patient Have a Medical Advance Directive? No  Would patient like information on creating a medical advance directive? No - Patient declined   Chief Complaint  Patient presents with  . Establish Care    New Patient to establish Care  . Health Maintenance    Tetanus, Colonoscopy, PNA and Influenza and Covid 19.     HPI: Patient is a 70 y.o. male seen today to establish with [facility].  His wife is a patient of mine for a few years and his father-in-law had been a resident at [facility] under my care for about 5 yrs before he passed away.  He has no active illnesses noted on his new patient packet.    He takes several vitamins:   C 2 g daily D3, K2 250/45 daily Zinc Mag citrate 150mg  2 at bedtime NAC 500mg  bid Innate immune support Probiotics 70billion CFU daily Spele restore 4 billion CFU daily Copper 2mg  weekly  Says his hayfever changed over time.  He would get cold like symptoms--not the rhinitis and puffy eyes like a kid, but more diffuse, but still coincides with ragweed time.  He sneezes some.  Tends not to get colds.  He'll just feel "under the weather" for a week.  He's not a vaccine guy.  He knows how they work and says that he picks on on logical fallacies in arguments.  He says  he's not going to get vaccinated against something that won't kill him and even if he does at his age, he says his kids won't be w/o food or anything.  He believes in proactive lifestyle not medicine.    Anxiety seems to be the core of most things that happen to him.  Notes the effects of the brain over the body.  Currently, has some difficulty with his GI system--affected by where his brain is.  Goes to sleep easily but wakes up and then goes back to sleep eventually.  Remembers a lot of dreams.  Takes a lot of magnesium for that.  Has learned to tell himself to shut up.  Says that his anxiolytic meds did not help him, but maybe on a very low dose they would have.  He had a relapse at one point and saw a psychiatrist, Dr. Birdena CrandallSmith--he put him on buspar and allowed him to adjust the dose.  He was on the lowest dose pill and cut it in quarters.  He felt some right arm paralysis in the '90s.  He had a stroke workup.   [facility] Del[facility]sp[facility] didn't think he'd had a stroke.    When he rides his bike, he might hit HR 200 on his bicycle.  He says he typically keeps it at 185 as his high.  His resting rate will be like 36/40 overnight (per an electronic device  he once had).  He wants me to be on the lookout for any bad trends in him.  He wants to avoid hospitalizations this way.   Says he did a "deep dive" on cholesterol and found that it's all untrue.  He is struggling to get himself to do more exercise.  He's weighed the same over years but it used to be muscle.   He had a colonoscopy about 10 yrs ago--no polyps or anything.  No family history.    He is partially color blind.    His dad had pancreatic ca.  His favorite cousin also had pancreatic and there was a grandfather who smoked that had lung cancer.  Has some hearing loss.  Has hearing aids.  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  Gets frustrated at restaurants.  Goes to [facility].    Had two mohs skin surgery.    Had  some labs with integrative medicine within the year.  I asked him to get [place] a copy of these to determine what additional labs would be appropriate for him.    Past Medical History:  Diagnosis Date  . Asthma   . Chronic ethmoidal sinusitis   . Chronic laryngitis   . Chronic maxillary sinusitis   . Chronic tonsillitis   . GERD (gastroesophageal reflux disease)   . Hay fever   . History of colonoscopy   . History of MRI 1997  . Sensory hearing loss, bilateral   . Tinnitus of both ears    Past Surgical History:  Procedure Laterality Date  . FRACTURE SURGERY     wrist  . WRIST SURGERY  2005   Repair 2 Broken Wrist by Dr.Bill Gramis  fell off ladder and shattered both radii Left feels like tendons go over plate sometimes  Social History   Socioeconomic History  . Marital status: Married    Spouse name: Not on file  . Number of children: Not on file  . Years of education: Not on file  . Highest education level: Not on file  Occupational History  . Not on file  Tobacco Use  . Smoking status: Never Smoker  . Smokeless tobacco: Never Used  Substance and Sexual Activity  . Alcohol use: Not on file  . Drug use: Never  . Sexual activity: Not on file  Other Topics Concern  . Not on file  Social History Narrative   Tobacco use, amount per day now: None/Never   Past tobacco use, amount per day: None.   How many years did you use tobacco: None.   Alcohol use (drinks per week): 1   Diet: Gluten Free, Low Dairy, Organic.   Do you drink/eat things with caffeine: Yes.   Marital status: Married                                 What year were you married? 1983   Do you live in a house, apartment, assisted living, condo, trailer, etc.? House.   Is it one or more stories? 2 stories.   How many persons live in your home? 2   Do you have pets in your home?( please list) Outdoor Cat.   Highest Level of education completed? Bachelors.   Current or past profession: Real Chief Technology Officer.    Do you exercise? No.  Type and how often?   Do you have a living will? No.   Do you have a DNR form?  No.                                 If not, do you want to discuss one?   Do you have signed POA/HPOA forms? No.                       If so, please bring to you appointment      Do you have any difficulty bathing or dressing yourself? No.   Do you have any difficulty preparing food or eating? No.   Do you have any difficulty managing your medications? No.   Do you have any difficulty managing your finances? No.   Do you have any difficulty affording your medications? No.   Social Determinants of Health   Financial Resource Strain: Not on file  Food Insecurity: Not on file  Transportation Needs: Not on file  Physical Activity: Not on file  Stress: Not on file  Social Connections: Not on file    reports that he has never smoked. He has never used smokeless tobacco. He reports that he does not use drugs. No history on file for alcohol use.  Family History  Problem Relation Age of Onset  . Suicidality Mother   . Arthritis Mother   . Pancreatic cancer Father   . Diabetes Father   . Bronchitis Brother   . Colitis Brother   . Asthma Son     Health Maintenance  Topic Date Due  . COVID-19 Vaccine (1) Never done  . TETANUS/TDAP  Never done  . COLONOSCOPY (Pts 45-76yrs Insurance coverage will need to be confirmed)  Never done  . PNA vac Low Risk Adult (1 of 2 - PCV13) Never done  . INFLUENZA VACCINE  Never done  . Hepatitis C Screening  Completed    No Known Allergies  Outpatient Encounter Medications as of 06/27/2020  Medication Sig  . Acetylcysteine (NAC PO) Take 500 mg by mouth in the morning and at bedtime.  . Ascorbic Acid (VITAMIN C PO) Take by mouth daily at 12 noon.  . COPPER PO Take 2 mg by mouth once a week.  Marland Kitchen MAGNESIUM CITRATE PO Take 150 mg by mouth in the morning and at bedtime.  . Multiple Vitamins-Minerals (ZINC PO) Take by mouth  daily at 12 noon.  . Probiotic Product (PROBIOTIC PO) Take 1 capsule by mouth daily at 12 noon. 70 Billion  . Vitamin D-Vitamin K (VITAMIN K2-VITAMIN D3 PO) Take by mouth daily at 12 noon.   No facility-administered encounter medications on file as of 06/27/2020.    Review of Systems  Constitutional: Negative for chills, fever and malaise/fatigue.  HENT: Positive for congestion, hearing loss and tinnitus. Negative for sore throat.   Eyes:       Wears glasses--20/15--has variable lenses  Respiratory: Negative for cough and shortness of breath.   Cardiovascular: Negative for chest pain, palpitations and leg swelling.  Gastrointestinal: Positive for heartburn. Negative for abdominal pain.       Occasional feeling off in stomach; twice a month gerd helped by peppermint oil in water  Genitourinary: Negative for dysuria.  Musculoskeletal: Negative for falls.       Has periods of time when neck aches (some DDD in neck seen on CT), some stiffness; his back  will go out every other year--will get muscle spasms (thinks from parachute landing as a child)  Neurological: Negative for dizziness and loss of consciousness.  Psychiatric/Behavioral: Negative for depression and memory loss. The patient is nervous/anxious.        Wakes up early    Vitals:   06/27/20 1404  BP: 128/82  Pulse: 84  Temp: 97.7 F (36.5 C)  TempSrc: Temporal  SpO2: 97%  Weight: 170 lb (77.1 kg)  Height: 6' (1.829 m)   Body mass index is 23.06 kg/m. Physical Exam Vitals reviewed.  Constitutional:      General: He is not in acute distress.    Appearance: Normal appearance. He is not toxic-appearing.  HENT:     Head: Normocephalic and atraumatic.     Right Ear: External ear normal.     Left Ear: External ear normal.     Ears:     Comments: Forgot hearing aids    Nose: Nose normal.     Mouth/Throat:     Pharynx: Oropharynx is clear. No oropharyngeal exudate.  Eyes:     Extraocular Movements: Extraocular movements  intact.     Conjunctiva/sclera: Conjunctivae normal.     Pupils: Pupils are equal, round, and reactive to light.     Comments: glasses  Cardiovascular:     Rate and Rhythm: Normal rate and regular rhythm.     Pulses: Normal pulses.     Heart sounds: Normal heart sounds. No murmur heard.   Pulmonary:     Effort: Pulmonary effort is normal.     Breath sounds: Normal breath sounds. No wheezing, rhonchi or rales.  Abdominal:     General: Bowel sounds are normal.     Palpations: Abdomen is soft.  Musculoskeletal:     Cervical back: Neck supple.  Lymphadenopathy:     Cervical: No cervical adenopathy.  Neurological:     General: No focal deficit present.     Mental Status: He is alert and oriented to person, place, and time.  Psychiatric:        Mood and Affect: Mood normal.     Comments: Talkative, sociable    Labs reviewed: Basic Metabolic Panel: No results for input(s): NA, K, CL, CO2, GLUCOSE, BUN, CREATININE, CALCIUM, MG, PHOS in the last 8760 hours. Liver Function Tests: No results for input(s): AST, ALT, ALKPHOS, BILITOT, PROT, ALBUMIN in the last 8760 hours. No results for input(s): LIPASE, AMYLASE in the last 8760 hours. No results for input(s): AMMONIA in the last 8760 hours. CBC: No results for input(s): WBC, NEUTROABS, HGB, HCT, MCV, PLT in the last 8760 hours. Cardiac Enzymes: No results for input(s): CKTOTAL, CKMB, CKMBINDEX, TROPONINI in the last 8760 hours. BNP: Invalid input(s): POCBNP No results found for: HGBA1C No results found for: TSH No results found for: VITAMINB12 No results found for: FOLATE No results found for: IRON, TIBC, FERRITIN  Imaging and Procedures noted on new patient packet: Colonoscopy 1997 MRI brain  Assessment/Plan 1. Anxiety -tries to control this with physiologic methods rather than medications -monitor  2. Psychophysiological insomnia -due to #1 -continue conservative interventions above  3. Aortic atherosclerosis  (HCC) -ideally we should know his fasting lipids and treat accordingly -need copy of last labs from integrative specialist to know what has and has not been checked  4. Presbycusis of both ears -cont f/u with [facility]  5. History of squamous cell carcinoma -prior mohs surgery -sees Dr. Karlyn Agee for annual checks  6. Environmental allergies -not  taking any regular meds for hayfever, symptoms come and go  Labs/tests ordered:  Would like to get cbc, bmp, liver panel, flp, mag, copper, D3 if not done at integrative to be sure he's not getting excessive intake as some doses not entered into chart, but need labs from integrative doctor  Lenzie Sandler L. Sheneika Walstad, D.O. Geriatrics [facility] [address] [place], [place] [address] Cell Phone (Mon-Fri 8am-5pm):  [phone] On Call:  [phone] & follow prompts after 5pm & weekends Office Phone:  [phone] Office Fax:  [phone]

## 2020-06-27 NOTE — Patient Instructions (Signed)
Please bring by a copy of your labs so I can see them and we can determine if you need more done here.

## 2020-07-03 ENCOUNTER — Encounter: Payer: Self-pay | Admitting: Internal Medicine

## 2020-07-03 DIAGNOSIS — F419 Anxiety disorder, unspecified: Secondary | ICD-10-CM | POA: Insufficient documentation

## 2020-07-03 DIAGNOSIS — H9113 Presbycusis, bilateral: Secondary | ICD-10-CM | POA: Insufficient documentation

## 2020-07-03 DIAGNOSIS — I700 Atherosclerosis of aorta: Secondary | ICD-10-CM | POA: Insufficient documentation

## 2020-07-03 DIAGNOSIS — F5104 Psychophysiologic insomnia: Secondary | ICD-10-CM | POA: Insufficient documentation

## 2020-07-03 DIAGNOSIS — Z8589 Personal history of malignant neoplasm of other organs and systems: Secondary | ICD-10-CM | POA: Insufficient documentation

## 2020-07-03 DIAGNOSIS — Z9109 Other allergy status, other than to drugs and biological substances: Secondary | ICD-10-CM | POA: Insufficient documentation

## 2020-07-04 ENCOUNTER — Encounter: Payer: Self-pay | Admitting: Internal Medicine

## 2020-08-10 DIAGNOSIS — R251 Tremor, unspecified: Secondary | ICD-10-CM | POA: Diagnosis not present

## 2020-08-10 DIAGNOSIS — R790 Abnormal level of blood mineral: Secondary | ICD-10-CM | POA: Diagnosis not present

## 2020-08-10 DIAGNOSIS — F419 Anxiety disorder, unspecified: Secondary | ICD-10-CM | POA: Diagnosis not present

## 2020-08-10 LAB — BASIC METABOLIC PANEL
BUN: 14 (ref 4–21)
CO2: 22 (ref 13–22)
Chloride: 103 (ref 99–108)
Creatinine: 1.1 (ref 0.6–1.3)
Glucose: 85
Potassium: 4.9 (ref 3.4–5.3)
Sodium: 142 (ref 137–147)

## 2020-08-10 LAB — HEPATIC FUNCTION PANEL
ALT: 20 (ref 10–40)
AST: 23 (ref 14–40)
Alkaline Phosphatase: 78 (ref 25–125)
Bilirubin, Total: 0.5

## 2020-08-10 LAB — LIPID PANEL
Cholesterol: 237 — AB (ref 0–200)
Triglycerides: 145 (ref 40–160)

## 2020-08-10 LAB — CBC AND DIFFERENTIAL
HCT: 50 (ref 41–53)
Hemoglobin: 16.9 (ref 13.5–17.5)
Neutrophils Absolute: 49
Platelets: 188 (ref 150–399)
WBC: 4.3

## 2020-08-10 LAB — CBC: RBC: 5.45 — AB (ref 3.87–5.11)

## 2020-08-10 LAB — COMPREHENSIVE METABOLIC PANEL
Albumin: 4.6 (ref 3.5–5.0)
Calcium: 9.6 (ref 8.7–10.7)
Globulin: 2.5

## 2020-11-23 ENCOUNTER — Encounter: Payer: Self-pay | Admitting: *Deleted

## 2020-11-29 ENCOUNTER — Encounter: Payer: Self-pay | Admitting: Diagnostic Neuroimaging

## 2020-11-29 ENCOUNTER — Ambulatory Visit: Payer: Medicare Other | Admitting: Diagnostic Neuroimaging

## 2020-11-29 ENCOUNTER — Other Ambulatory Visit: Payer: Self-pay

## 2020-11-29 VITALS — BP 128/84 | HR 77 | Ht 72.0 in | Wt 162.5 lb

## 2020-11-29 DIAGNOSIS — G252 Other specified forms of tremor: Secondary | ICD-10-CM

## 2020-11-29 NOTE — Patient Instructions (Signed)
  POSTURAL TREMOR (essential tremor vs enhanced physiologic tremor) - monitor symptoms; check TSH per PCP

## 2020-11-29 NOTE — Progress Notes (Signed)
[facility]  PATIENT: Scott Barker DOB: 04-May-1951  REFERRING CLINICIAN: Ferdinand Cava, MD HISTORY FROM: patient  REASON FOR VISIT: new consult    HISTORICAL  CHIEF COMPLAINT:  Chief Complaint  Patient presents with   New Patient (Initial Visit)    RM 6 with wife- Pt reports he has been experiencing tremors in bilateral hands over the last 3-4 months. Feels like left hand is worse than the right. Reports sx are intermittent and notices the sx are more pronounced when he is active are using his hands. Another sx he has noticed a soreness in his jaw, sts sx started about the same time frame. Pt was prescribed Ivermectin for covid 5 weeks ago, and noticed improvement in his sx.     HISTORY OF PRESENT ILLNESS:   70 year old male here for elevation of tremor.  Patient has had 6 to 7 months of mild postural tremor affecting bilateral upper extremities and chin.  Symptoms may be worse with anxiety.  His brother also has a tremor of uncertain etiology.  No gait or balance difficulty.  No headaches.  No weakness.  No change in smell or taste.   REVIEW OF SYSTEMS: Full 14 system review of systems performed and negative with exception of: as per HPI.  ALLERGIES: No Known Allergies  HOME MEDICATIONS: Outpatient Medications Prior to Visit  Medication Sig Dispense Refill   Acetylcysteine (NAC PO) Take 500 mg by mouth in the morning and at bedtime.     Ascorbic Acid (VITAMIN C PO) Take by mouth daily at 12 noon.     COPPER PO Take 2 mg by mouth once a week.     MAGNESIUM CITRATE PO Take 150 mg by mouth in the morning and at bedtime.     Multiple Vitamins-Minerals (ZINC PO) Take by mouth daily at 12 noon.     Probiotic Product (PROBIOTIC PO) Take 1 capsule by mouth daily at 12 noon. 70 Billion     Vitamin D-Vitamin K (VITAMIN K2-VITAMIN D3 PO) Take by mouth daily at 12 noon.     No facility-administered medications prior to visit.    PAST MEDICAL HISTORY: Past  Medical History:  Diagnosis Date   Asthma    Chronic ethmoidal sinusitis    Chronic laryngitis    Chronic maxillary sinusitis    Chronic tonsillitis    GERD (gastroesophageal reflux disease)    Hay fever    History of colonoscopy    History of MRI 1997   Sensory hearing loss, bilateral    Tinnitus of both ears    Tremor     PAST SURGICAL HISTORY: Past Surgical History:  Procedure Laterality Date   FRACTURE SURGERY     wrist   WRIST SURGERY  2005   Repair 2 Broken Wrist by Dr.Bill Gramis    FAMILY HISTORY: Family History  Problem Relation Age of Onset   Suicidality Mother    Arthritis Mother    Pancreatic cancer Father    Diabetes Father    Bronchitis Brother    Colitis Brother    Tremor Brother    Asthma Son     SOCIAL HISTORY: Social History   Socioeconomic History   Marital status: Married    Spouse name: Not on file   Number of children: Not on file   Years of education: Not on file   Highest education level: Bachelor's degree (e.g., BA, AB, BS)  Occupational History   Not on file  Tobacco Use  Smoking status: Never   Smokeless tobacco: Never  Substance and Sexual Activity   Alcohol use: Not on file   Drug use: Never   Sexual activity: Not on file  Other Topics Concern   Not on file  Social History Narrative   Right handed   Lives at home with wife    Caffeine-    Social Determinants of Health   Financial Resource Strain: Not on file  Food Insecurity: Not on file  Transportation Needs: Not on file  Physical Activity: Not on file  Stress: Not on file  Social Connections: Not on file  Intimate Partner Violence: Not on file     PHYSICAL EXAM  GENERAL EXAM/CONSTITUTIONAL: Vitals:  Vitals:   11/29/20 1101  BP: 128/84  Pulse: 77  SpO2: 97%  Weight: 162 lb 8 oz (73.7 kg)  Height: 6' (1.829 m)   Body mass index is 22.04 kg/m. Wt Readings from Last 3 Encounters:  11/29/20 162 lb 8 oz (73.7 kg)  06/27/20 170 lb (77.1 kg)  08/29/16  164 lb (74.4 kg)   Patient is in no distress; well developed, nourished and groomed; neck is supple  CARDIOVASCULAR: Examination of carotid arteries is normal; no carotid bruits Regular rate and rhythm, no murmurs Examination of peripheral vascular system by observation and palpation is normal  EYES: Ophthalmoscopic exam of optic discs and posterior segments is normal; no papilledema or hemorrhages No results found.  MUSCULOSKELETAL: Gait, strength, tone, movements noted in Neurologic exam below  NEUROLOGIC: MENTAL STATUS:  No flowsheet data found. awake, alert, oriented to person, place and time recent and remote memory intact normal attention and concentration language fluent, comprehension intact, naming intact fund of knowledge appropriate  CRANIAL NERVE:  2nd - no papilledema on fundoscopic exam 2nd, 3rd, 4th, 6th - pupils equal and reactive to light, visual fields full to confrontation, extraocular muscles intact, no nystagmus 5th - facial sensation symmetric 7th - facial strength symmetric 8th - hearing intact 9th - palate elevates symmetrically, uvula midline 11th - shoulder shrug symmetric 12th - tongue protrusion midline  MOTOR:  normal bulk and tone, full strength in the BUE, BLE MINIMAL POSTURAL AND ACTION TREMOR IN BUE   SENSORY:  normal and symmetric to light touch, temperature, vibration  COORDINATION:  finger-nose-finger, fine finger movements normal  REFLEXES:  deep tendon reflexes present and symmetric  GAIT/STATION:  narrow based gait     DIAGNOSTIC DATA (LABS, IMAGING, TESTING) - I reviewed patient records, labs, notes, testing and imaging myself where available.  Lab Results  Component Value Date   WBC 6.0 08/29/2016   HGB 17.3 (H) 08/29/2016   HCT 50.2 08/29/2016   MCV 88.5 08/29/2016   PLT 191 08/29/2016      Component Value Date/Time   NA 138 08/29/2016 0951   K 4.2 08/29/2016 0951   CL 103 08/29/2016 0951   CO2 26  08/29/2016 0951   GLUCOSE 86 08/29/2016 0951   BUN 17 08/29/2016 0951   CREATININE 1.05 08/29/2016 0951   CREATININE 1.24 10/20/2015 1511   CALCIUM 9.4 08/29/2016 0951   PROT 8.0 08/29/2016 0951   ALBUMIN 4.6 08/29/2016 0951   AST 19 08/29/2016 0951   ALT 18 08/29/2016 0951   ALKPHOS 56 08/29/2016 0951   BILITOT 1.6 (H) 08/29/2016 0951   GFRNONAA >60 08/29/2016 0951   GFRNONAA 61 10/20/2015 1511   GFRAA >60 08/29/2016 0951   GFRAA 71 10/20/2015 1511   No results found for: CHOL, HDL, LDLCALC, LDLDIRECT,  TRIG, CHOLHDL No results found for: IWPY0D No results found for: VITAMINB12 No results found for: TSH      ASSESSMENT AND PLAN  70 y.o. year old male here with:  Dx:  1. Postural tremor      PLAN:  POSTURAL TREMOR (since early 2022; ddx: essential tremor vs enhanced physiologic tremor) - monitor symptoms; check TSH per PCP  Return for return to PCP, pending if symptoms worsen or fail to improve.    Suanne Marker, MD 11/29/2020, 11:40 AM Certified in Neurology, Neurophysiology and Neuroimaging  [facility] [address], [address] [place], [place] [address] [phone]

## 2020-12-01 DIAGNOSIS — D3131 Benign neoplasm of right choroid: Secondary | ICD-10-CM | POA: Diagnosis not present

## 2020-12-01 DIAGNOSIS — H43811 Vitreous degeneration, right eye: Secondary | ICD-10-CM | POA: Diagnosis not present

## 2020-12-01 DIAGNOSIS — H2513 Age-related nuclear cataract, bilateral: Secondary | ICD-10-CM | POA: Diagnosis not present

## 2020-12-02 ENCOUNTER — Ambulatory Visit: Payer: Medicare Other | Admitting: Diagnostic Neuroimaging

## 2021-01-06 ENCOUNTER — Other Ambulatory Visit: Payer: Self-pay

## 2021-01-06 ENCOUNTER — Encounter: Payer: Self-pay | Admitting: Adult Health

## 2021-01-06 ENCOUNTER — Ambulatory Visit (INDEPENDENT_AMBULATORY_CARE_PROVIDER_SITE_OTHER): Payer: Medicare Other | Admitting: Adult Health

## 2021-01-06 VITALS — BP 132/78 | HR 96 | Temp 98.4°F | Ht 72.0 in | Wt 167.2 lb

## 2021-01-06 DIAGNOSIS — B001 Herpesviral vesicular dermatitis: Secondary | ICD-10-CM | POA: Diagnosis not present

## 2021-01-06 DIAGNOSIS — K047 Periapical abscess without sinus: Secondary | ICD-10-CM

## 2021-01-06 MED ORDER — AMOXICILLIN-POT CLAVULANATE 500-125 MG PO TABS: 1.0000 | ORAL_TABLET | Freq: Three times a day (TID) | ORAL | 0 refills | Status: AC

## 2021-01-06 NOTE — Patient Instructions (Addendum)
Dental Abscess  A dental abscess is a collection of pus in or around a tooth that results from an infection. An abscess can cause pain in the affected area as well as other symptoms. Treatment is important to help with symptoms and to prevent theinfection from spreading. What are the causes? This condition is caused by a bacterial infection around the root of the tooth that involves the inner part of the tooth (pulp). It may result from: Severe tooth decay. Trauma to the tooth, such as a broken or chipped tooth, that allows bacteria to enter into the pulp. Severe gum disease around a tooth. What increases the risk? This condition is more likely to develop in males. It is also more likely to develop in people who: Have dental decay (cavities). Eat sugary snacks between meals. Use tobacco products. Have diabetes. Have a weakened disease-fighting system (immune system). Do not brush and care for their teeth regularly. What are the signs or symptoms? Symptoms of this condition include: Severe pain in and around the infected tooth. Swelling and redness around the infected tooth, in the mouth, or in the face. Tenderness. Pus drainage. Bad breath. Bitter taste in the mouth. Difficulty swallowing. Difficulty opening the mouth. Nausea. Vomiting. Chills. Swollen neck glands. Fever. How is this diagnosed? This condition is diagnosed based on: Your symptoms and your medical and dental history. An examination of the infected tooth. During the exam, your dentist may tap on the infected tooth. You may also have X-rays of the affected area. How is this treated? This condition is treated by getting rid of the infection. This may be done with: Incision and drainage. This procedure is done by making an incision in the abscess to drain out the pus. Removing pus is the first priority in treating an abscess. Antibiotic medicines. These may be used in certain situations. Antibacterial mouth  rinse. A root canal. This may be performed to save the tooth. Your dentist accesses the visible part of your tooth (crown) with a drill and removes any damaged pulp. Then the space is filled and sealed off. Tooth extraction. The tooth is pulled out if it cannot be saved by other treatment. You may also receive treatment for pain, such as: Acetaminophen or NSAIDs. Gels that contain a numbing medicine. An injection to block the pain near your nerve. Follow these instructions at home: Medicines Take over-the-counter and prescription medicines only as told by your dentist. If you were prescribed an antibiotic, take it as told by your dentist. Do not stop taking the antibiotic even if you start to feel better. If you were prescribed a gel that contains a numbing medicine, use it exactly as told in the directions. Do not use these gels for children who are younger than 59 years of age. Do not drive or use heavy machinery while taking prescription pain medicine. General instructions  Rinse out your mouth often with salt water to relieve pain or swelling. To make a salt-water mixture, completely dissolve -1 tsp of salt in 1 cup of warm water. Eat a soft diet while your abscess is healing. Drink enough fluid to keep your urine pale yellow. Do not apply heat to the outside of your mouth. Do not use any products that contain nicotine or tobacco, such as cigarettes and e-cigarettes. If you need help quitting, ask your health care provider. Keep all follow-up visits as told by your dentist. This is important.  How is this prevented? Brush your teeth every morning and night with  fluoride toothpaste. Floss one time each day. Get regularly scheduled dental cleanings. Consider having a dental sealant applied on teeth that have deep holes (caries). Drink fluoridated water regularly. This includes most tap water. Check the label on bottled water to see if it contains fluoride. Drink water instead of sugary  drinks. Eat healthy meals and snacks. Wear a mouth guard or face shield to protect your teeth while playing sports. Contact a health care provider if: Your pain is worse and is not helped by medicine. Get help right away if: You have a fever or chills. Your symptoms suddenly get worse. You have a very bad headache. You have problems breathing or swallowing. You have trouble opening your mouth. You have swelling in your neck or around your eye. Summary A dental abscess is a collection of pus in or around a tooth that results from an infection. A dental abscess may result from severe tooth decay, trauma to the tooth, or severe gum disease around a tooth. Symptoms include severe pain, swelling, redness, and drainage of pus in and around the infected tooth. The first priority in treating a dental abscess is to drain out the pus. Treatment may also involve removing damage inside the tooth (root canal) or pulling out (extracting) the tooth. This information is not intended to replace advice given to you by your health care provider. Make sure you discuss any questions you have with your healthcare provider. Document Revised: 10/09/2019 Document Reviewed: 11/20/2019 Elsevier Patient Education  2022 [employer] Cold Sore  A cold sore, also called a fever blister, is a small, fluid-filled sore that forms inside of the mouth or on the lips, gums, nose, chin, or cheeks. Coldsores can spread to other parts of the body, such as the eyes or fingers. Cold sores can spread from person to person (are contagious) until the sores crust over completely. Most cold sores go away within 2 weeks. What are the causes? Cold sores are caused by a virus (herpes simplex virus type 1, HSV-1). The virus can spread from person to person through close contact, such as through: Kissing. Touching the affected area. Sharing personal items such as lip balm, razors, a drinking glass, or eating utensils. What increases the  risk? You are more likely to develop this condition if you: Are tired, stressed, or sick. Are having your period (menstruating). Are pregnant. Take certain medicines. Are out in cold weather or get too much sun. What are the signs or symptoms? Symptoms of a cold sore outbreak go through different stages. These are the stages of a cold sore: Tingling, itching, or burning is felt 1-2 days before the outbreak. Fluid-filled blisters appear on the lips, inside the mouth, on the nose, or on the cheeks. The blisters start to ooze clear fluid. The blisters dry up, and a yellow crust appears in their place. The crust falls off. In some cases, other symptoms can develop during a cold sore outbreak. These can include: Fever. Sore throat. Headache. Muscle aches. Swollen neck glands. How is this treated? There is no cure for cold sores or the virus that causes them. There is also no vaccine to prevent the virus. Most cold sores go away on their own without treatment within 2 weeks. Your doctor may prescribe medicines to: Help with pain. Keep the virus from growing. Help you heal faster. Medicines may be in the form of creams, gels, pills, or a shot. Follow these instructions at home: Medicines Take or apply over-the-counter and prescription medicines  only as told by your doctor. Use a cotton-tip swab to apply creams or gels to your sores. Ask your doctor if you can take lysine supplements. These may help with healing. Sore care  Do not touch the sores or pick the scabs. Wash your hands often. Do not touch your eyes without washing your hands first. Keep the sores clean and dry. If told, put ice on the sores: Put ice in a plastic bag. Place a towel between your skin and the bag. Leave the ice on for 20 minutes, 2-3 times a day.  Eating and drinking Eat a soft, bland diet. Avoid eating hot, cold, or salty foods. These can hurt your mouth. Use a straw if it hurts to drink out of a  glass. Eat foods that have a lot of lysine in them. These include meat, fish, and dairy products. Avoid sugary foods, chocolates, nuts, and grains. These foods have a high amount of a substance (arginine) that can cause the virus to grow. Lifestyle Do not kiss, have oral sex, or share personal items until your sores heal. Stress, poor sleep, and being out in the sun can trigger outbreaks. Make sure you: Do activities that help you relax, such as deep breathing exercises or meditation. Get enough sleep. Apply sunscreen on your lips before you go out in the sun. Contact a doctor if: You have symptoms for more than 2 weeks. You have pus coming from the sores. You have redness that is spreading. You have pain or irritation in your eye. You get sores on your genitals. Your sores do not heal within 2 weeks. You get cold sores often. Get help right away if: You have a fever and your symptoms suddenly get worse. You have a headache and confusion. You have tiredness (fatigue). You do not want to eat as much as normal (loss of appetite). You have a stiff neck or are sensitive to light. Summary A cold sore is a small, fluid-filled sore that forms inside of the mouth or on the lips, gums, nose, chin, or cheeks. Cold sores can spread from person to person (are contagious) until the sores crust over completely. Most cold sores go away within 2 weeks. Wash your hands often. Do not touch your eyes without washing your hands first. Do not kiss, have oral sex, or share personal items until your sores heal. Contact a doctor if your sores do not heal within 2 weeks. This information is not intended to replace advice given to you by your health care provider. Make sure you discuss any questions you have with your healthcare provider. Document Revised: 08/20/2018 Document Reviewed: 09/30/2017 Elsevier Patient Education  2022 [employer].

## 2021-01-06 NOTE — Progress Notes (Signed)
[facility] clinic  Provider:   Kenard Gower - DNP  Code Status:  Full Code  Goals of Care:  Advanced Directives 06/27/2020  Does Patient Have a Medical Advance Directive? No  Would patient like information on creating a medical advance directive? No - Patient declined     Chief Complaint  Patient presents with   Acute Visit    Patient presents today for upper left swelling in lip for 5-6 days. He reports soreness and feels like a knot. Also reports the swelling has spread to the right side.    HPI: Patient is a 70 y.o. male seen today for an acute visit for a swollen lip which started 3-4 days ago. He noted a node inside the left upper lip. This morning, he woke up with a bad breath which he stated is unusual. He noticed that the lip swelling has spread to the right side. He has not eaten anything new, no trauma. No sensitivities on his teeth, no fever nor chills. He denies ever smoking. He had seen the dentist 6 months ago.   Past Medical History:  Diagnosis Date   Asthma    Chronic ethmoidal sinusitis    Chronic laryngitis    Chronic maxillary sinusitis    Chronic tonsillitis    GERD (gastroesophageal reflux disease)    Hay fever    History of colonoscopy    History of MRI 1997   Sensory hearing loss, bilateral    Tinnitus of both ears    Tremor     Past Surgical History:  Procedure Laterality Date   FRACTURE SURGERY     wrist   WRIST SURGERY  2005   Repair 2 Broken Wrist by Dr.Bill Gramis    No Known Allergies  Outpatient Encounter Medications as of 01/06/2021  Medication Sig   Acetylcysteine (NAC PO) Take 500 mg by mouth in the morning and at bedtime.   Ascorbic Acid (VITAMIN C PO) Take by mouth daily at 12 noon.   COPPER PO Take 2 mg by mouth once a week.   MAGNESIUM CITRATE PO Take 150 mg by mouth in the morning and at bedtime.   Multiple Vitamins-Minerals (ZINC PO) Take by mouth daily at 12 noon.   Probiotic Product (PROBIOTIC PO) Take 1 capsule by  mouth daily at 12 noon. 70 Billion   Vitamin D-Vitamin K (VITAMIN K2-VITAMIN D3 PO) Take by mouth daily at 12 noon.   No facility-administered encounter medications on file as of 01/06/2021.    Review of Systems:  Review of Systems  Constitutional:  Negative for activity change, appetite change, chills and fever.  Eyes:  Negative for pain and discharge.       Has a film over right eye, fluid, was seen by the eye doctor  Respiratory:  Negative for cough and shortness of breath.   Cardiovascular:  Negative for chest pain and palpitations.  Gastrointestinal:  Negative for abdominal distention and constipation.  Endocrine: Negative.   Genitourinary:  Negative for difficulty urinating.  Musculoskeletal:  Negative for back pain and neck pain.  Skin: Negative.   Allergic/Immunologic: Positive for environmental allergies.       Ragweed  Neurological:  Negative for dizziness, speech difficulty and headaches.  Psychiatric/Behavioral: Negative.     Health Maintenance  Topic Date Due   COVID-19 Vaccine (1) Never done   TETANUS/TDAP  Never done   Zoster Vaccines- Shingrix (1 of 2) Never done   COLONOSCOPY (Pts 45-55yrs Insurance coverage will need to be  confirmed)  12/30/2016   INFLUENZA VACCINE  12/12/2020   PNA vac Low Risk Adult (1 of 2 - PCV13) 07/03/2021 (Originally 02/26/2016)   Hepatitis C Screening  Completed   HPV VACCINES  Aged Out    Physical Exam: Vitals:   01/06/21 1438  BP: 132/78  Pulse: 96  Temp: 98.4 F (36.9 C)  TempSrc: Temporal  SpO2: 97%  Weight: 167 lb 3.2 oz (75.8 kg)  Height: 6' (1.829 m)   Body mass index is 22.68 kg/m. Physical Exam Constitutional:      Appearance: Normal appearance.  HENT:     Head: Normocephalic and atraumatic.     Nose: Nose normal.     Mouth/Throat:     Mouth: Mucous membranes are moist.     Comments: Has a small nodule inside left upper lip. No erythema noted on gums Eyes:     Extraocular Movements: Extraocular movements  intact.     Conjunctiva/sclera: Conjunctivae normal.     Pupils: Pupils are equal, round, and reactive to light.  Cardiovascular:     Rate and Rhythm: Normal rate and regular rhythm.  Pulmonary:     Effort: Pulmonary effort is normal.     Breath sounds: Normal breath sounds.  Abdominal:     General: Bowel sounds are normal.     Palpations: Abdomen is soft.  Musculoskeletal:        General: Normal range of motion.     Cervical back: Normal range of motion and neck supple. No rigidity or tenderness.  Skin:    General: Skin is warm.  Neurological:     Mental Status: He is alert and oriented to person, place, and time.  Psychiatric:        Mood and Affect: Mood normal.        Behavior: Behavior normal.    Labs reviewed: Basic Metabolic Panel: No results for input(s): NA, K, CL, CO2, GLUCOSE, BUN, CREATININE, CALCIUM, MG, PHOS, TSH in the last 8760 hours. Liver Function Tests: No results for input(s): AST, ALT, ALKPHOS, BILITOT, PROT, ALBUMIN in the last 8760 hours. No results for input(s): LIPASE, AMYLASE in the last 8760 hours. No results for input(s): AMMONIA in the last 8760 hours. CBC: No results for input(s): WBC, NEUTROABS, HGB, HCT, MCV, PLT in the last 8760 hours. Lipid Panel: No results for input(s): CHOL, HDL, LDLCALC, TRIG, CHOLHDL, LDLDIRECT in the last 8760 hours. No results found for: HGBA1C  Procedures since last visit: No results found.  Assessment/Plan  1. Cold sore -  advised to gargle with warm salty water 3X/day   2. Tooth infection -  if  node does not get better, will start antibiotic -   no lymphadenopathy noted on the neck -  No noted erythema on gums -   advised to follow up with his dentist - amoxicillin-clavulanate (AUGMENTIN) 500-125 MG tablet; Take 1 tablet (500 mg total) by mouth 3 (three) times daily for 5 days.  Dispense: 15 tablet; Refill: 0  -  Discussed that if the node is not any better in a week, will need to follow up at  [facility].   Labs/tests ordered:  None  Next appt:  01/19/2021

## 2021-01-19 ENCOUNTER — Ambulatory Visit (INDEPENDENT_AMBULATORY_CARE_PROVIDER_SITE_OTHER): Payer: Medicare Other | Admitting: Family

## 2021-01-19 ENCOUNTER — Other Ambulatory Visit: Payer: Self-pay

## 2021-01-19 ENCOUNTER — Encounter: Payer: Self-pay | Admitting: Family

## 2021-01-19 VITALS — BP 110/86 | HR 94 | Temp 96.8°F | Resp 16 | Ht 72.0 in | Wt 166.0 lb

## 2021-01-19 DIAGNOSIS — Z7689 Persons encountering health services in other specified circumstances: Secondary | ICD-10-CM | POA: Diagnosis not present

## 2021-01-19 DIAGNOSIS — K219 Gastro-esophageal reflux disease without esophagitis: Secondary | ICD-10-CM | POA: Diagnosis not present

## 2021-01-19 DIAGNOSIS — E782 Mixed hyperlipidemia: Secondary | ICD-10-CM | POA: Diagnosis not present

## 2021-01-19 DIAGNOSIS — F5104 Psychophysiologic insomnia: Secondary | ICD-10-CM

## 2021-01-19 DIAGNOSIS — F411 Generalized anxiety disorder: Secondary | ICD-10-CM

## 2021-01-19 NOTE — Progress Notes (Signed)
Provider: Richarda Blade FNP-C   Jabria Loos, Donalee Citrin, NP  Patient Care Team: Zenola Dezarn, Donalee Citrin, NP as PCP - General (Family Medicine) Keturah Barre, MD as Consulting Physician (Otolaryngology) Arminda Resides, MD as Consulting Physician (Dermatology)  Extended Emergency Contact Information Primary Emergency Contact: Carolanne Grumbling Address: [address]          [place], [place] [address] [place] of [place] Home Phone: [phone] Relation: Spouse  Code Status:  Full Code  Goals of care: Advanced Directive information Advanced Directives 01/19/2021  Does Patient Have a Medical Advance Directive? No  Would patient like information on creating a medical advance directive? No - Patient declined     Chief Complaint  Patient presents with   Medical Management of Chronic Issues    7 month follow up.   Health Maintenance    Discuss the need for Colonoscopy.    Immunizations    Discuss the need for shingrix vaccine, influenza vaccine, tetanus vaccine, and covid booster.     HPI:  Pt is a 70 y.o. male seen today to establish care with provider for medical management of chronic diseases.Has medical history of chronic seasonal sinusitis,Asthma not on any medication,bilateral hearing loss,Anxiety among others. He denies any acute issues this visit. Had recent lab work done ordered by another provider.follows up with Nutritionist.His total chol 237,LDL 155,TRG 145, LP-insulin resistant Marker high 81,vitamin B6 55.3, Iodine low 30.7.His CBC,CMP ,copper,vit B 12 and Ferritin were all within normal range ( 08/10/2020) ordered by Dr. Ferdinand Cava.  States not concerned about high cholesterol.Has done a lot of research on high cholesterol.His diet is heart Healthy and has a son who is a Nutritionist who helps.Has not been exercising as he used to prior to COVID-19 lock down.used to ride his Bike. He is due for shingrix,Influenza,Tetanus and COVID-19 vaccine but declines all vaccine. Also  due for Colonoscopy but also declines but will notify provider if Cologuard desired.      Past Medical History:  Diagnosis Date   Asthma    Chronic ethmoidal sinusitis    Chronic laryngitis    Chronic maxillary sinusitis    Chronic tonsillitis    GERD (gastroesophageal reflux disease)    Hay fever    History of colonoscopy    History of MRI 1997   Sensory hearing loss, bilateral    Tinnitus of both ears    Tremor    Past Surgical History:  Procedure Laterality Date   FRACTURE SURGERY     wrist   WRIST SURGERY  2005   Repair 2 Broken Wrist by Dr.Bill Gramis    No Known Allergies  Allergies as of 01/19/2021   No Known Allergies      Medication List        Accurate as of January 19, 2021 11:21 AM. If you have any questions, ask your nurse or doctor.          COPPER PO Take 2 mg by mouth once a week.   MAGNESIUM CITRATE PO Take 150 mg by mouth in the morning and at bedtime.   NAC PO Take 500 mg by mouth in the morning and at bedtime.   PROBIOTIC PO Take 1 capsule by mouth daily at 12 noon. 70 Billion   VITAMIN C PO Take 2,000 mg by mouth daily at 12 noon.   VITAMIN K2-VITAMIN D3 PO Take by mouth daily at 12 noon.   ZINC PO Take by mouth daily at 12 noon.  Review of Systems  Constitutional:  Negative for appetite change, chills, fatigue, fever and unexpected weight change.  HENT:  Negative for congestion, dental problem, ear discharge, ear pain, facial swelling, hearing loss, nosebleeds, postnasal drip, rhinorrhea, sinus pressure, sinus pain, sneezing, sore throat, tinnitus and trouble swallowing.   Eyes:  Positive for visual disturbance. Negative for pain, discharge, redness and itching.       Follows up with Ophthalmology   Respiratory:  Negative for cough, chest tightness, shortness of breath and wheezing.   Cardiovascular:  Negative for chest pain, palpitations and leg swelling.  Gastrointestinal:  Negative for abdominal distention,  abdominal pain, blood in stool, constipation, diarrhea, nausea and vomiting.  Endocrine: Negative for cold intolerance, heat intolerance, polydipsia, polyphagia and polyuria.  Genitourinary:  Negative for difficulty urinating, dysuria, flank pain, frequency and urgency.  Musculoskeletal:  Negative for arthralgias, back pain, gait problem, joint swelling, myalgias, neck pain and neck stiffness.  Skin:  Negative for color change, pallor, rash and wound.  Neurological:  Negative for dizziness, syncope, speech difficulty, weakness, light-headedness, numbness and headaches.  Hematological:  Does not bruise/bleed easily.  Psychiatric/Behavioral:  Positive for sleep disturbance. Negative for agitation, behavioral problems, confusion, hallucinations, self-injury and suicidal ideas. The patient is nervous/anxious.        Occasional anxiety deep breath exercises effective     There is no immunization history on file for this patient. Pertinent  Health Maintenance Due  Topic Date Due   COLONOSCOPY (Pts [id number] Insurance coverage will need to be confirmed)  12/30/2016   INFLUENZA VACCINE  Never done   PNA vac Low Risk Adult (1 of 2 - PCV13) 07/03/2021 (Originally 02/26/2016)   Fall Risk  01/19/2021 06/27/2020 06/27/2020 10/20/2015  Falls in the past year? 0 0 0 Yes  Number falls in past yr: 0 0 0 -  Injury with Fall? 0 0 0 -  Risk for fall due to : No Fall Risks - - -  Follow up Falls evaluation completed - - -   Functional Status Survey:    Vitals:   01/19/21 1111  BP: 110/86  Pulse: 94  Resp: 16  Temp: (!) 96.8 F (36 C)  SpO2: 96%  Weight: 166 lb (75.3 kg)  Height: 6' (1.829 m)   Body mass index is 22.51 kg/m. Physical Exam Vitals reviewed.  Constitutional:      General: He is not in acute distress.    Appearance: Normal appearance. He is normal weight. He is not ill-appearing or diaphoretic.  HENT:     Head: Normocephalic.     Right Ear: Tympanic membrane, ear canal and external  ear normal. There is no impacted cerumen.     Left Ear: Tympanic membrane, ear canal and external ear normal. There is no impacted cerumen.     Nose: Nose normal. No congestion or rhinorrhea.     Mouth/Throat:     Mouth: Mucous membranes are moist.     Pharynx: Oropharynx is clear. No oropharyngeal exudate or posterior oropharyngeal erythema.  Eyes:     General: No scleral icterus.       Right eye: No discharge.        Left eye: No discharge.     Extraocular Movements: Extraocular movements intact.     Conjunctiva/sclera: Conjunctivae normal.     Pupils: Pupils are equal, round, and reactive to light.  Neck:     Vascular: No carotid bruit.  Cardiovascular:     Rate and Rhythm: Normal rate and regular  rhythm.     Pulses: Normal pulses.     Heart sounds: Normal heart sounds. No murmur heard.   No friction rub. No gallop.  Pulmonary:     Effort: Pulmonary effort is normal. No respiratory distress.     Breath sounds: Normal breath sounds. No wheezing, rhonchi or rales.  Chest:     Chest wall: No tenderness.  Abdominal:     General: Bowel sounds are normal. There is no distension.     Palpations: Abdomen is soft. There is no mass.     Tenderness: There is no abdominal tenderness. There is no right CVA tenderness, left CVA tenderness, guarding or rebound.  Musculoskeletal:        General: No swelling or tenderness. Normal range of motion.     Cervical back: Normal range of motion. No rigidity or tenderness.     Right lower leg: No edema.     Left lower leg: No edema.  Lymphadenopathy:     Cervical: No cervical adenopathy.  Skin:    General: Skin is warm and dry.     Coloration: Skin is not pale.     Findings: No bruising, erythema, lesion or rash.     Comments: Old healed surgical incision on bilateral inner wrist area.  Neurological:     Mental Status: He is alert and oriented to person, place, and time.     Cranial Nerves: No cranial nerve deficit.     Sensory: No sensory  deficit.     Motor: No weakness.     Coordination: Coordination normal.     Gait: Gait normal.  Psychiatric:        Mood and Affect: Mood normal.        Speech: Speech normal.        Behavior: Behavior normal.        Thought Content: Thought content normal.        Judgment: Judgment normal.    Labs reviewed: No results for input(s): NA, K, CL, CO2, GLUCOSE, BUN, CREATININE, CALCIUM, MG, PHOS in the last 8760 hours. No results for input(s): AST, ALT, ALKPHOS, BILITOT, PROT, ALBUMIN in the last 8760 hours. No results for input(s): WBC, NEUTROABS, HGB, HCT, MCV, PLT in the last 8760 hours. No results found for: TSH No results found for: HGBA1C No results found for: CHOL, HDL, LDLCALC, LDLDIRECT, TRIG, CHOLHDL  Significant Diagnostic Results in last 30 days:  No results found.  Assessment/Plan 1. Establishing care with new doctor, encounter for Declines immunization. Medication and labs reviewed patient counselled regarding yearly exam, prevention of dental and periodontal disease, diet, regular sustained exercise for at least 30 minutes x 3 /week. Notify provider if Cologuard or colonoscopy desired then will send referreal.  2. Gastroesophageal reflux disease without esophagitis Hgb stable. Symptoms well controlled takes mint as needed which has been effective.   3. Psychophysiological insomnia Melatonin 3 mg tablet recommended.  4. Mixed hyperlipidemia  total chol 237,LDL 155,TRG 145  Declined any treatment has done own research and again not worried about his cholesterol levels thinks levels will normalize once he restart his exercise.  - continue on low carbohydrates,low saturated fat and high vegetable diet. - also advised to exercise at least three times per week for 30 minutes.   5. Generalized anxiety disorder Occasional. Continue with deep breathing exercise whenever symptoms recurs.  - Notify provider or go to ED if symptoms worsen    Family/ staff Communication:  Reviewed plan of care with patient verbalized  understanding.   Labs/tests ordered: will notify provider if labs to be done here or with previous Nutritionist.   Next Appointment : one year for Annual Physical Examination.   Caesar Bookman, NP

## 2021-03-03 DIAGNOSIS — H2513 Age-related nuclear cataract, bilateral: Secondary | ICD-10-CM | POA: Diagnosis not present

## 2021-03-03 DIAGNOSIS — D3131 Benign neoplasm of right choroid: Secondary | ICD-10-CM | POA: Diagnosis not present

## 2021-03-03 DIAGNOSIS — H43811 Vitreous degeneration, right eye: Secondary | ICD-10-CM | POA: Diagnosis not present

## 2021-09-01 DIAGNOSIS — H2513 Age-related nuclear cataract, bilateral: Secondary | ICD-10-CM | POA: Diagnosis not present

## 2021-09-01 DIAGNOSIS — H43811 Vitreous degeneration, right eye: Secondary | ICD-10-CM | POA: Diagnosis not present

## 2021-09-01 DIAGNOSIS — D3131 Benign neoplasm of right choroid: Secondary | ICD-10-CM | POA: Diagnosis not present

## 2022-01-22 ENCOUNTER — Ambulatory Visit (INDEPENDENT_AMBULATORY_CARE_PROVIDER_SITE_OTHER): Payer: Medicare Other | Admitting: Adult Health

## 2022-01-22 ENCOUNTER — Encounter: Payer: Self-pay | Admitting: Adult Health

## 2022-01-22 ENCOUNTER — Ambulatory Visit: Payer: Medicare Other | Admitting: Family

## 2022-01-22 VITALS — BP 130/82 | HR 58 | Temp 97.3°F | Resp 18 | Ht 72.0 in | Wt 169.0 lb

## 2022-01-22 DIAGNOSIS — E782 Mixed hyperlipidemia: Secondary | ICD-10-CM

## 2022-01-22 DIAGNOSIS — F411 Generalized anxiety disorder: Secondary | ICD-10-CM

## 2022-01-22 DIAGNOSIS — R7309 Other abnormal glucose: Secondary | ICD-10-CM | POA: Diagnosis not present

## 2022-01-22 DIAGNOSIS — Z0000 Encounter for general adult medical examination without abnormal findings: Secondary | ICD-10-CM

## 2022-01-22 NOTE — Patient Instructions (Signed)
Preventive Care 65 Years and Older, Male Preventive care refers to lifestyle choices and visits with your health care provider that can promote health and wellness. Preventive care visits are also called wellness exams. What can I expect for my preventive care visit? Counseling During your preventive care visit, your health care provider may ask about your: Medical history, including: Past medical problems. Family medical history. History of falls. Current health, including: Emotional well-being. Home life and relationship well-being. Sexual activity. Memory and ability to understand (cognition). Lifestyle, including: Alcohol, nicotine or tobacco, and drug use. Access to firearms. Diet, exercise, and sleep habits. Work and work environment. Sunscreen use. Safety issues such as seatbelt and bike helmet use. Physical exam Your health care provider will check your: Height and weight. These may be used to calculate your BMI (body mass index). BMI is a measurement that tells if you are at a healthy weight. Waist circumference. This measures the distance around your waistline. This measurement also tells if you are at a healthy weight and may help predict your risk of certain diseases, such as type 2 diabetes and high blood pressure. Heart rate and blood pressure. Body temperature. Skin for abnormal spots. What immunizations do I need?  Vaccines are usually given at various ages, according to a schedule. Your health care provider will recommend vaccines for you based on your age, medical history, and lifestyle or other factors, such as travel or where you work. What tests do I need? Screening Your health care provider may recommend screening tests for certain conditions. This may include: Lipid and cholesterol levels. Diabetes screening. This is done by checking your blood sugar (glucose) after you have not eaten for a while (fasting). Hepatitis C test. Hepatitis B test. HIV (human  immunodeficiency virus) test. STI (sexually transmitted infection) testing, if you are at risk. Lung cancer screening. Colorectal cancer screening. Prostate cancer screening. Abdominal aortic aneurysm (AAA) screening. You may need this if you are a current or former smoker. Talk with your health care provider about your test results, treatment options, and if necessary, the need for more tests. Follow these instructions at home: Eating and drinking  Eat a diet that includes fresh fruits and vegetables, whole grains, lean protein, and low-fat dairy products. Limit your intake of foods with high amounts of sugar, saturated fats, and salt. Take vitamin and mineral supplements as recommended by your health care provider. Do not drink alcohol if your health care provider tells you not to drink. If you drink alcohol: Limit how much you have to 0-2 drinks a day. Know how much alcohol is in your drink. In the [place] one drink equals one 12 oz bottle of beer (355 mL), one 5 oz glass of wine (148 mL), or one 1 oz glass of hard liquor (44 mL). Lifestyle Brush your teeth every morning and night with fluoride toothpaste. Floss one time each day. Exercise for at least 30 minutes 5 or more days each week. Do not use any products that contain nicotine or tobacco. These products include cigarettes, chewing tobacco, and vaping devices, such as e-cigarettes. If you need help quitting, ask your health care provider. Do not use drugs. If you are sexually active, practice safe sex. Use a condom or other form of protection to prevent STIs. Take aspirin only as told by your health care provider. Make sure that you understand how much to take and what form to take. Work with your health care provider to find out whether it is safe   and beneficial for you to take aspirin daily. Ask your health care provider if you need to take a cholesterol-lowering medicine (statin). Find healthy ways to manage stress, such  as: Meditation, yoga, or listening to music. Journaling. Talking to a trusted person. Spending time with friends and family. Safety Always wear your seat belt while driving or riding in a vehicle. Do not drive: If you have been drinking alcohol. Do not ride with someone who has been drinking. When you are tired or distracted. While texting. If you have been using any mind-altering substances or drugs. Wear a helmet and other protective equipment during sports activities. If you have firearms in your house, make sure you follow all gun safety procedures. Minimize exposure to UV radiation to reduce your risk of skin cancer. What's next? Visit your health care provider once a year for an annual wellness visit. Ask your health care provider how often you should have your eyes and teeth checked. Stay up to date on all vaccines. This information is not intended to replace advice given to you by your health care provider. Make sure you discuss any questions you have with your health care provider. Document Revised: 10/26/2020 Document Reviewed: 10/26/2020 Elsevier Patient Education  2023 Elsevier Inc[employer]  

## 2022-01-22 NOTE — Progress Notes (Signed)
Location:  [facility]  Provider:  Durenda Age DNP   Code Status:  Full Code  Goals of Care:     01/22/2022    9:00 AM  Advanced Directives  Does Patient Have a Medical Advance Directive? No  Would patient like information on creating a medical advance directive? No - Patient declined     Chief Complaint  Patient presents with   Annual Exam    Patient is here for a physical exam, patient is due for PCV 13 as well as colonoscopy. [facility] verified    HPI: Patient is a 71 y.o. male seen today for a annual visit. He has a PMH of hyperlipidemia and anxiety. He declines PCV 13 and does not have COVID-19 vaccine. He said that he would think about colonoscopy. . Noted that cholesterol 237, elevated (08/10/20). He takes vitamin supplements only. He uses bilateral hearing aids. He does not exercise.    Past Medical History:  Diagnosis Date   Asthma    Chronic ethmoidal sinusitis    Chronic laryngitis    Chronic maxillary sinusitis    Chronic tonsillitis    GERD (gastroesophageal reflux disease)    Hay fever    History of colonoscopy    History of MRI 1997   Sensory hearing loss, bilateral    Tinnitus of both ears    Tremor     Past Surgical History:  Procedure Laterality Date   FRACTURE SURGERY     wrist   WRIST SURGERY  2005   Repair 2 Broken Wrist by Dr.Bill Gramis    No Known Allergies  Outpatient Encounter Medications as of 01/22/2022  Medication Sig   Acetylcysteine (NAC PO) Take 500 mg by mouth in the morning and at bedtime.   Ascorbic Acid (VITAMIN C PO) Take 2,000 mg by mouth daily at 12 noon.   COPPER PO Take 2 mg by mouth once a week.   MAGNESIUM CITRATE PO Take 150 mg by mouth in the morning and at bedtime.   Multiple Vitamins-Minerals (ZINC PO) Take by mouth daily at 12 noon.   Probiotic Product (PROBIOTIC PO) Take 1 capsule by mouth daily at 12 noon. 70 Billion   Vitamin D-Vitamin K (VITAMIN K2-VITAMIN D3 PO) Take by  mouth daily at 12 noon.   No facility-administered encounter medications on file as of 01/22/2022.    Review of Systems:  Review of Systems  Constitutional:  Negative for activity change, appetite change and fever.  HENT:  Negative for sore throat.   Eyes: Negative.   Cardiovascular:  Negative for chest pain and leg swelling.  Gastrointestinal:  Negative for abdominal distention, diarrhea and vomiting.  Genitourinary:  Negative for dysuria, frequency and urgency.  Skin:  Negative for color change.  Neurological:  Negative for dizziness and headaches.  Psychiatric/Behavioral:  Negative for behavioral problems and sleep disturbance. The patient is not nervous/anxious.     Health Maintenance  Topic Date Due   Pneumonia Vaccine 63+ Years old (1 - PCV) Never done   COLONOSCOPY (Pts 45-31yr Insurance coverage will need to be confirmed)  12/30/2016   Hepatitis C Screening  Completed   HPV VACCINES  Aged Out   INFLUENZA VACCINE  Discontinued   TETANUS/TDAP  Discontinued   COVID-19 Vaccine  Discontinued   Zoster Vaccines- Shingrix  Discontinued    Physical Exam: Vitals:   01/22/22 0931  BP: 130/82  Pulse: (!) 58  Resp: 18  Temp: (!) 97.3 F (36.3 C)  TempSrc: Temporal  SpO2: 93%  Weight: 169 lb (76.7 kg)  Height: 6' (1.829 m)   Body mass index is 22.92 kg/m. Physical Exam Constitutional:      General: He is not in acute distress.    Appearance: Normal appearance.  HENT:     Head: Normocephalic and atraumatic.     Ears:     Comments: Has bilateral hearing aids.    Mouth/Throat:     Mouth: Mucous membranes are moist.  Eyes:     Conjunctiva/sclera: Conjunctivae normal.  Cardiovascular:     Rate and Rhythm: Normal rate and regular rhythm.     Pulses: Normal pulses.     Heart sounds: Normal heart sounds.  Pulmonary:     Effort: Pulmonary effort is normal.     Breath sounds: Normal breath sounds.  Abdominal:     General: Bowel sounds are normal.     Palpations:  Abdomen is soft.  Musculoskeletal:        General: No swelling. Normal range of motion.     Cervical back: Normal range of motion.  Skin:    General: Skin is warm and dry.  Neurological:     General: No focal deficit present.     Mental Status: He is alert and oriented to person, place, and time.  Psychiatric:        Mood and Affect: Mood normal.        Behavior: Behavior normal.        Thought Content: Thought content normal.        Judgment: Judgment normal.     Labs reviewed: Basic Metabolic Panel: No results for input(s): "NA", "K", "CL", "CO2", "GLUCOSE", "BUN", "CREATININE", "CALCIUM", "MG", "PHOS", "TSH" in the last 8760 hours. Liver Function Tests: No results for input(s): "AST", "ALT", "ALKPHOS", "BILITOT", "PROT", "ALBUMIN" in the last 8760 hours. No results for input(s): "LIPASE", "AMYLASE" in the last 8760 hours. No results for input(s): "AMMONIA" in the last 8760 hours. CBC: Recent Labs    01/22/22 1037  WBC 5.7  NEUTROABS 3,186  HGB 17.8*  HCT 53.1*  MCV 92.7  PLT 199   Lipid Panel: No results for input(s): "CHOL", "HDL", "LDLCALC", "TRIG", "CHOLHDL", "LDLDIRECT" in the last 8760 hours. No results found for: "HGBA1C"  Procedures since last visit: No results found.  Assessment/Plan  1. Mixed hyperlipidemia -  discussed importance of exercise -  stated that he has been eating healthy diet - Lipid panel  2. Generalized anxiety disorder -  mood is stable -  not taking any medications  3. Encounter for preventative adult health care examination - Hemoglobin A1C - Hep B Surface Antibody - Hepatitis C Antibody - HIV antibody (with reflex) - CBC with Differential/Platelets - CMP with eGFR([facility]) -  declines colonoscopy and PCV13 -  does not want COVID-19 vaccine   Labs/tests ordered:   A1C, hep B and C antibody, HIV antibody, lipid panel, CMP and CBC  Next appt:  as needed

## 2022-01-23 LAB — COMPLETE METABOLIC PANEL WITH GFR
AG Ratio: 1.5 (calc) (ref 1.0–2.5)
ALT: 20 U/L (ref 9–46)
AST: 23 U/L (ref 10–35)
Albumin: 4.5 g/dL (ref 3.6–5.1)
Alkaline phosphatase (APISO): 61 U/L (ref 35–144)
BUN: 21 mg/dL (ref 7–25)
CO2: 25 mmol/L (ref 20–32)
Calcium: 10.5 mg/dL — ABNORMAL HIGH (ref 8.6–10.3)
Chloride: 104 mmol/L (ref 98–110)
Creat: 1.17 mg/dL (ref 0.70–1.28)
Globulin: 3.1 g/dL (calc) (ref 1.9–3.7)
Glucose, Bld: 86 mg/dL (ref 65–99)
Potassium: 5.1 mmol/L (ref 3.5–5.3)
Sodium: 141 mmol/L (ref 135–146)
Total Bilirubin: 1.3 mg/dL — ABNORMAL HIGH (ref 0.2–1.2)
Total Protein: 7.6 g/dL (ref 6.1–8.1)
eGFR: 67 mL/min/{1.73_m2} (ref >=60)

## 2022-01-23 LAB — CBC WITH DIFFERENTIAL/PLATELET
Absolute Monocytes: 507 cells/uL (ref 200–950)
Basophils Absolute: 57 cells/uL (ref 0–200)
Basophils Relative: 1 %
Eosinophils Absolute: 91 cells/uL (ref 15–500)
Eosinophils Relative: 1.6 %
HCT: 53.1 % — ABNORMAL HIGH (ref 38.5–50.0)
Hemoglobin: 17.8 g/dL — ABNORMAL HIGH (ref 13.2–17.1)
Lymphs Abs: 1858 cells/uL (ref 850–3900)
MCH: 31.1 pg (ref 27.0–33.0)
MCHC: 33.5 g/dL (ref 32.0–36.0)
MCV: 92.7 fL (ref 80.0–100.0)
MPV: 11.8 fL (ref 7.5–12.5)
Monocytes Relative: 8.9 %
Neutro Abs: 3186 cells/uL (ref 1500–7800)
Neutrophils Relative %: 55.9 %
Platelets: 199 10*3/uL (ref 140–400)
RBC: 5.73 10*6/uL (ref 4.20–5.80)
RDW: 12.6 % (ref 11.0–15.0)
Total Lymphocyte: 32.6 %
WBC: 5.7 10*3/uL (ref 3.8–10.8)

## 2022-01-23 LAB — HEPATITIS B SURFACE ANTIBODY,QUALITATIVE: Hep B S Ab: NONREACTIVE

## 2022-01-23 LAB — LIPID PANEL
Cholesterol: 291 mg/dL — ABNORMAL HIGH (ref <200)
HDL: 58 mg/dL (ref >=40)
LDL Cholesterol (Calc): 201 mg/dL (calc) — ABNORMAL HIGH
Non-HDL Cholesterol (Calc): 233 mg/dL (calc) — ABNORMAL HIGH (ref <130)
Total CHOL/HDL Ratio: 5 (calc) — ABNORMAL HIGH (ref <5.0)
Triglycerides: 156 mg/dL — ABNORMAL HIGH (ref <150)

## 2022-01-23 LAB — HEPATITIS C ANTIBODY: Hepatitis C Ab: NONREACTIVE

## 2022-01-23 LAB — HEMOGLOBIN A1C
Hgb A1c MFr Bld: 5.5 % of total Hgb (ref <5.7)
Mean Plasma Glucose: 111 mg/dL
eAG (mmol/L): 6.2 mmol/L

## 2022-01-23 LAB — HIV ANTIBODY (ROUTINE TESTING W REFLEX): HIV 1&2 Ab, 4th Generation: NONREACTIVE

## 2022-01-31 NOTE — Progress Notes (Signed)
-    cholesterol 291, elevated (normal <200), triglycerides 156, elevated (normal  <150) and LDL 201 (normal <100) -   can be repeated in 3 months after lifestyle modifications such as increasing vegetables and fruits, beans Nuts and seeds (mediterranean diet) and regular exercise - if still elevated after lifestyle modifications then  will need to start statins

## 2022-02-21 DIAGNOSIS — E8840 Mitochondrial metabolism disorder, unspecified: Secondary | ICD-10-CM | POA: Diagnosis not present

## 2022-02-21 DIAGNOSIS — R5383 Other fatigue: Secondary | ICD-10-CM | POA: Diagnosis not present

## 2022-02-21 DIAGNOSIS — R799 Abnormal finding of blood chemistry, unspecified: Secondary | ICD-10-CM | POA: Diagnosis not present

## 2022-02-21 DIAGNOSIS — D6489 Other specified anemias: Secondary | ICD-10-CM | POA: Diagnosis not present

## 2022-08-06 DIAGNOSIS — B0089 Other herpesviral infection: Secondary | ICD-10-CM | POA: Diagnosis not present

## 2022-08-06 DIAGNOSIS — R7989 Other specified abnormal findings of blood chemistry: Secondary | ICD-10-CM | POA: Diagnosis not present

## 2022-08-06 DIAGNOSIS — R7982 Elevated C-reactive protein (CRP): Secondary | ICD-10-CM | POA: Diagnosis not present

## 2022-08-06 DIAGNOSIS — M792 Neuralgia and neuritis, unspecified: Secondary | ICD-10-CM | POA: Diagnosis not present

## 2022-08-06 DIAGNOSIS — D649 Anemia, unspecified: Secondary | ICD-10-CM | POA: Diagnosis not present

## 2022-08-06 DIAGNOSIS — B349 Viral infection, unspecified: Secondary | ICD-10-CM | POA: Diagnosis not present

## 2022-09-11 DIAGNOSIS — K080 Exfoliation of teeth due to systemic causes: Secondary | ICD-10-CM | POA: Diagnosis not present

## 2022-10-03 ENCOUNTER — Telehealth: Payer: Medicare Other

## 2022-10-03 NOTE — Telephone Encounter (Signed)
Attempted to call patient because he was due for Colonoscopy. Left message to return call

## 2023-04-22 ENCOUNTER — Ambulatory Visit: Payer: Medicare Other | Admitting: Family

## 2023-04-22 ENCOUNTER — Encounter: Payer: Self-pay | Admitting: Family

## 2023-04-22 VITALS — BP 116/78 | HR 87 | Temp 97.0°F | Resp 20 | Ht 72.0 in | Wt 167.4 lb

## 2023-04-22 DIAGNOSIS — R238 Other skin changes: Secondary | ICD-10-CM

## 2023-04-22 DIAGNOSIS — M542 Cervicalgia: Secondary | ICD-10-CM | POA: Diagnosis not present

## 2023-04-22 DIAGNOSIS — R21 Rash and other nonspecific skin eruption: Secondary | ICD-10-CM | POA: Diagnosis not present

## 2023-04-22 DIAGNOSIS — J029 Acute pharyngitis, unspecified: Secondary | ICD-10-CM

## 2023-04-22 LAB — POCT RAPID STREP A (OFFICE): Rapid Strep A Screen: NEGATIVE

## 2023-04-22 MED ORDER — DOXYCYCLINE HYCLATE 100 MG PO TABS: 100.0000 mg | ORAL_TABLET | Freq: Two times a day (BID) | ORAL | 0 refills | Status: AC

## 2023-04-22 NOTE — Patient Instructions (Addendum)
-   Apply warm compressor to right side of neck 15-20 minutes daily as needed  - Notify provider if symptoms worsen or fail to improve

## 2023-04-22 NOTE — Progress Notes (Unsigned)
Provider: Richarda Blade FNP-C  Myelle Poteat, Donalee Citrin, NP  Patient Care Team: Karolee Meloni, Donalee Citrin, NP as PCP - General (Family Medicine) Keturah Barre, MD as Consulting Physician (Otolaryngology) Arminda Resides, MD as Consulting Physician (Dermatology)  Extended Emergency Contact Information Primary Emergency Contact: Carolanne Grumbling Address: [address]          [place], [place] [address] [place] of [place] Home Phone: [phone] Relation: Spouse  Code Status:  Full Code  Goals of care: Advanced Directive information    01/22/2022    9:00 AM  Advanced Directives  Does Patient Have a Medical Advance Directive? No  Would patient like information on creating a medical advance directive? No - Patient declined     Chief Complaint  Patient presents with   Acute Visit    Patient presents today for back neck pain.    HPI:  Pt is a 72 y.o. male seen today for an acute visit for evaluation of neck pain for couple weeks.states had sensitivity to the back of the neck radiating to the scalp and forehead.right side of the neck muscle hurts  thought pulled a muscle or sleep.Has a bump described as like mosquito bite with a white stuff for the past week.states recently went to the [place] and stayed at an AirBNB.  Has had sensitivity to teeth in the past from tooth procedure.He denies any fever,chills,nausea,vomiting or diarrhea.states has a tendency to worry and over think of the condition.He read on the internet and worries if the sensitivity on the right side of neck and scalp could be cancer.    Past Medical History:  Diagnosis Date   Asthma    Chronic ethmoidal sinusitis    Chronic laryngitis    Chronic maxillary sinusitis    Chronic tonsillitis    GERD (gastroesophageal reflux disease)    Hay fever    History of colonoscopy    History of MRI 1997   Sensory hearing loss, bilateral    Tinnitus of both ears    Tremor    Past Surgical History:  Procedure Laterality Date    FRACTURE SURGERY     wrist   WRIST SURGERY  2005   Repair 2 Broken Wrist by Dr.Bill Gramis    No Known Allergies  Outpatient Encounter Medications as of 04/22/2023  Medication Sig   Acetylcysteine (NAC PO) Take 500 mg by mouth in the morning and at bedtime.   MAGNESIUM CITRATE PO Take 150 mg by mouth in the morning and at bedtime.   Multiple Vitamins-Minerals (ZINC PO) Take by mouth daily at 12 noon.   Probiotic Product (PROBIOTIC PO) Take 1 capsule by mouth daily at 12 noon. 70 Billion   Vitamin D-Vitamin K (VITAMIN K2-VITAMIN D3 PO) Take by mouth daily at 12 noon.   Ascorbic Acid (VITAMIN C PO) Take 2,000 mg by mouth daily at 12 noon.   COPPER PO Take 2 mg by mouth once a week. (Patient not taking: Reported on 04/22/2023)   No facility-administered encounter medications on file as of 04/22/2023.    Review of Systems  Constitutional:  Negative for appetite change, chills, fatigue, fever and unexpected weight change.  HENT:  Positive for sore throat. Negative for congestion, dental problem, ear discharge, ear pain, facial swelling, hearing loss, nosebleeds, postnasal drip, rhinorrhea, sinus pressure, sinus pain, sneezing, tinnitus and trouble swallowing.   Eyes:  Negative for pain, discharge, redness, itching and visual disturbance.  Respiratory:  Negative for cough, chest tightness, shortness of breath and  wheezing.   Cardiovascular:  Negative for chest pain, palpitations and leg swelling.  Musculoskeletal:  Positive for neck stiffness. Negative for arthralgias, back pain, gait problem, joint swelling, myalgias and neck pain.       Right side of neck skin sensitive to light touch   Skin:  Positive for rash. Negative for color change, pallor and wound.  Neurological:  Negative for dizziness, speech difficulty, weakness, light-headedness, numbness and headaches.       " Sensitive scalp"  Psychiatric/Behavioral:  Negative for agitation, behavioral problems, confusion, hallucinations,  self-injury, sleep disturbance and suicidal ideas. The patient is nervous/anxious.     There is no immunization history for the selected administration types on file for this patient. Pertinent  Health Maintenance Due  Topic Date Due   Colonoscopy  12/30/2016   INFLUENZA VACCINE  Discontinued      10/20/2015    2:10 PM 06/27/2020    1:51 PM 06/27/2020    2:08 PM 01/19/2021   11:03 AM 01/22/2022    9:00 AM  Fall Risk  Falls in the past year? Yes 0 0 0 0  Was there an injury with Fall?  0 0 0 0  Fall Risk Category Calculator  0 0 0 0  Fall Risk Category (Retired)  Low Low Low Low  (RETIRED) Patient Fall Risk Level  Low fall risk Low fall risk Low fall risk Low fall risk  Patient at Risk for Falls Due to    No Fall Risks No Fall Risks  Fall risk Follow up    Falls evaluation completed Falls evaluation completed   Functional Status Survey:    Vitals:   04/22/23 0921  BP: 116/78  Pulse: 87  Resp: 20  Temp: (!) 97 F (36.1 C)  SpO2: 97%  Weight: 167 lb 6.4 oz (75.9 kg)  Height: 6' (1.829 m)   Body mass index is 22.7 kg/m. Physical Exam Vitals reviewed.  Constitutional:      General: He is not in acute distress.    Appearance: Normal appearance. He is normal weight. He is not ill-appearing or diaphoretic.  HENT:     Head: Normocephalic.     Right Ear: Tympanic membrane, ear canal and external ear normal. There is no impacted cerumen.     Left Ear: Tympanic membrane, ear canal and external ear normal. There is no impacted cerumen.     Ears:     Comments: Hearing aids in place     Nose: Nose normal. No congestion or rhinorrhea.     Mouth/Throat:     Mouth: Mucous membranes are moist.     Pharynx: Oropharynx is clear. Posterior oropharyngeal erythema present. No oropharyngeal exudate.  Eyes:     General: No scleral icterus.       Right eye: No discharge.        Left eye: No discharge.     Extraocular Movements: Extraocular movements intact.     Conjunctiva/sclera:  Conjunctivae normal.     Pupils: Pupils are equal, round, and reactive to light.  Neck:     Vascular: No carotid bruit.     Comments: Right side neck tender to palpation  Cardiovascular:     Rate and Rhythm: Normal rate and regular rhythm.     Pulses: Normal pulses.     Heart sounds: Normal heart sounds. No murmur heard.    No friction rub. No gallop.  Pulmonary:     Effort: Pulmonary effort is normal. No respiratory distress.  Breath sounds: Normal breath sounds. No wheezing, rhonchi or rales.  Chest:     Chest wall: No tenderness.  Abdominal:     General: Bowel sounds are normal. There is no distension.     Palpations: Abdomen is soft. There is no mass.     Tenderness: There is no abdominal tenderness. There is no right CVA tenderness, left CVA tenderness, guarding or rebound.  Musculoskeletal:        General: No swelling or tenderness. Normal range of motion.     Cervical back: Normal range of motion. No rigidity.     Right lower leg: No edema.     Left lower leg: No edema.  Lymphadenopathy:     Cervical: No cervical adenopathy.  Skin:    General: Skin is warm and dry.     Coloration: Skin is not pale.     Findings: Rash present. No bruising, erythema or lesion.       Neurological:     Mental Status: He is alert and oriented to person, place, and time.     Cranial Nerves: No cranial nerve deficit.     Sensory: No sensory deficit.     Motor: No weakness.     Coordination: Coordination normal.     Gait: Gait normal.  Psychiatric:        Mood and Affect: Mood normal.        Speech: Speech normal.        Behavior: Behavior normal.     Labs reviewed: No results for input(s): "NA", "K", "CL", "CO2", "GLUCOSE", "BUN", "CREATININE", "CALCIUM", "MG", "PHOS" in the last 8760 hours. No results for input(s): "AST", "ALT", "ALKPHOS", "BILITOT", "PROT", "ALBUMIN" in the last 8760 hours. No results for input(s): "WBC", "NEUTROABS", "HGB", "HCT", "MCV", "PLT" in the last 8760  hours. No results found for: "TSH" Lab Results  Component Value Date   HGBA1C 5.5 01/22/2022   Lab Results  Component Value Date   CHOL 291 (H) 01/22/2022   HDL 58 01/22/2022   LDLCALC 201 (H) 01/22/2022   TRIG 156 (H) 01/22/2022   CHOLHDL 5.0 (H) 01/22/2022    Significant Diagnostic Results in last 30 days:  No results found.  Assessment/Plan 1. Skin sensitivity Reports back of the neck,scalp to forehead skin sensitivity to light touch. Negative exam findings during visit no pain illicit to light touch.  - continue to monitor   2. Sore throat Oral pharynx erythema noted  Tender to palpate along right side of neck  - doxycycline (VIBRA-TABS) 100 MG tablet; Take 1 tablet (100 mg total) by mouth 2 (two) times daily for 7 days.  Dispense: 14 tablet; Refill: 0 - POC Rapid Strep A negative   3. Neck pain on right side Suspect due to sore throat or possible muscle spasm from sleeping off the pillow. - apply warm compressor 15-20 minutes daily as needed for pain - consider muscle relaxant if pain persist   4. Rash and nonspecific skin eruption Red round bumps scattered on right upper back,scalp and anterior neck with hard center worrisome for insect bite.non-tender to touch and without any fluid or drainage.will treat with doxycycline possible bed bug bite from recent stay at an [employer]BN[employer]  - doxycycline (VIBRA-TABS) 100 MG tablet; Take 1 tablet (100 mg total) by mouth 2 (two) times daily for 7 days.  Dispense: 14 tablet; Refill: 0 -Notify provider if symptoms worsen  Family/ staff Communication: Reviewed plan of care with patient and wife verbalized understanding  Labs/tests ordered:  None   Next Appointment: Return if symptoms worsen or fail to improve.   Caesar Bookman, NP

## 2024-01-20 DIAGNOSIS — R7301 Impaired fasting glucose: Secondary | ICD-10-CM | POA: Diagnosis not present

## 2024-01-20 DIAGNOSIS — R5383 Other fatigue: Secondary | ICD-10-CM | POA: Diagnosis not present

## 2024-01-20 DIAGNOSIS — R799 Abnormal finding of blood chemistry, unspecified: Secondary | ICD-10-CM | POA: Diagnosis not present

## 2024-01-20 DIAGNOSIS — E063 Autoimmune thyroiditis: Secondary | ICD-10-CM | POA: Diagnosis not present

## 2024-01-20 DIAGNOSIS — R946 Abnormal results of thyroid function studies: Secondary | ICD-10-CM | POA: Diagnosis not present

## 2024-01-20 DIAGNOSIS — E782 Mixed hyperlipidemia: Secondary | ICD-10-CM | POA: Diagnosis not present

## 2024-01-20 DIAGNOSIS — E039 Hypothyroidism, unspecified: Secondary | ICD-10-CM | POA: Diagnosis not present

## 2024-01-20 DIAGNOSIS — E559 Vitamin D deficiency, unspecified: Secondary | ICD-10-CM | POA: Diagnosis not present

## 2024-01-20 DIAGNOSIS — D6489 Other specified anemias: Secondary | ICD-10-CM | POA: Diagnosis not present
